# Patient Record
Sex: Female | Born: 1940 | Race: White | Hispanic: No | Marital: Single | Smoking: Current some day smoker
Health system: Southern US, Community
[De-identification: ages and names within clinical notes are randomized; demographics above are authoritative.]

## PROBLEM LIST (undated history)

## (undated) DIAGNOSIS — F0390 Unspecified dementia without behavioral disturbance: Secondary | ICD-10-CM

## (undated) DIAGNOSIS — H547 Unspecified visual loss: Secondary | ICD-10-CM

---

## 2014-04-18 ENCOUNTER — Emergency Department (HOSPITAL_COMMUNITY)
Admission: EM | Admit: 2014-04-18 | Discharge: 2014-04-18 | Disposition: A | Discharge status: Home / Self Care | Payer: Medicare Other | Attending: Emergency Medicine | Admitting: Emergency Medicine

## 2014-04-18 ENCOUNTER — Encounter (HOSPITAL_COMMUNITY): Payer: Self-pay | Admitting: *Deleted

## 2014-04-18 DIAGNOSIS — B86 Scabies: Secondary | ICD-10-CM | POA: Diagnosis not present

## 2014-04-18 DIAGNOSIS — Z720 Tobacco use: Secondary | ICD-10-CM | POA: Diagnosis not present

## 2014-04-18 DIAGNOSIS — R21 Rash and other nonspecific skin eruption: Secondary | ICD-10-CM | POA: Diagnosis present

## 2014-04-18 MED ORDER — PERMETHRIN 5 % EX CREA: TOPICAL_CREAM | CUTANEOUS | Status: DC

## 2014-04-18 NOTE — ED Notes (Signed)
Pt is here with rash all over and itches for the last 3 months and states it is in between toes and starting between fingers

## 2014-04-18 NOTE — ED Provider Notes (Signed)
CSN: [id number]     Arrival date & time 04/18/14  1500 History  This chart was scribed for Teressa LowerVrinda Anna Beaird, NP with Benny LennertJoseph L Zammit, MD by Tonye RoyaltyJoshua Chen, ED Scribe. This patient was seen in room TR05C/TR05C and the patient's care was started at 3:46 PM.    Chief Complaint  Patient presents with  . Rash   Rash This is a new problem. The current episode started more than 1 month ago. The problem is unchanged. The affected locations include the left foot, right foot, left hand and right hand. The rash is characterized by itchiness. She was exposed to nothing. Pertinent negatives include no fever. Treatments tried: medicated powder. The treatment provided no relief.  HPI Comments: Gabrielle Gonzales is a 74 y.o. female who presents to the Emergency Department complaining of itching rash diffusely, including feet, hands, and waistline with onset 3 months ago. She states she has been using medicated powder without improvement.   History reviewed. No pertinent past medical history. History reviewed. No pertinent past surgical history. No family history on file. History  Substance Use Topics  . Smoking status: Current Some Day Smoker  . Smokeless tobacco: Not on file  . Alcohol Use: Yes     Comment: occ   OB History    No data available     Review of Systems  Constitutional: Negative for fever.  Skin: Positive for rash.  All other systems reviewed and are negative.     Allergies  Review of patient's allergies indicates not on file.  Home Medications   Prior to Admission medications   Not on File   BP 141/92 mmHg  Pulse 77  Temp(Src) 98.5 F (36.9 C)  Resp 18  Wt 210 lb 9 oz (95.511 kg)  SpO2 97% Physical Exam  Constitutional: She is oriented to person, place, and time. She appears well-developed and well-nourished.  HENT:  Head: Normocephalic and atraumatic.  Right Ear: External ear normal.  Left Ear: External ear normal.  Mouth/Throat: Oropharynx is clear and moist.  Eyes:  Conjunctivae are normal.  Neck: Normal range of motion. Neck supple.  Pulmonary/Chest: Effort normal.  Musculoskeletal: Normal range of motion.  Neurological: She is alert and oriented to person, place, and time.  Skin: Skin is warm and dry.  Multiple papular areas to webs of hands and feet and waistline  Psychiatric: She has a normal mood and affect.  Nursing note and vitals reviewed.   ED Course  Procedures (including critical care time)  DIAGNOSTIC STUDIES: Oxygen Saturation is 97% on room air, normal by my interpretation.    COORDINATION OF CARE: 3:49 PM Discussed treatment plan with patient at beside, the patient agrees with the plan and has no further questions at this time.   Labs Review Labs Reviewed - No data to display  Imaging Review No results found.   EKG Interpretation None      MDM   Final diagnoses:  Scabies    Rash consistent with scabies; will treat with elemite  I personally performed the services described in this documentation, which was scribed in my presence. The recorded information has been reviewed and is accurate.   Teressa LowerVrinda Rocky Rishel, NP 04/18/14 1606  Benny LennertJoseph L Zammit, MD 04/19/14 [phone]

## 2014-04-18 NOTE — Discharge Instructions (Signed)

## 2015-12-30 ENCOUNTER — Encounter (INDEPENDENT_AMBULATORY_CARE_PROVIDER_SITE_OTHER): Payer: Self-pay | Admitting: Sports Medicine

## 2015-12-30 ENCOUNTER — Ambulatory Visit (INDEPENDENT_AMBULATORY_CARE_PROVIDER_SITE_OTHER): Payer: Medicare Other | Admitting: Sports Medicine

## 2015-12-30 VITALS — BP 136/89 | HR 78 | Ht 63.5 in | Wt 210.0 lb

## 2015-12-30 DIAGNOSIS — M1710 Unilateral primary osteoarthritis, unspecified knee: Secondary | ICD-10-CM | POA: Insufficient documentation

## 2015-12-30 DIAGNOSIS — M1711 Unilateral primary osteoarthritis, right knee: Secondary | ICD-10-CM | POA: Diagnosis not present

## 2015-12-30 DIAGNOSIS — M25461 Effusion, right knee: Secondary | ICD-10-CM | POA: Diagnosis not present

## 2015-12-30 DIAGNOSIS — M179 Osteoarthritis of knee, unspecified: Secondary | ICD-10-CM | POA: Insufficient documentation

## 2015-12-30 MED ORDER — BUPIVACAINE HCL 0.5 % IJ SOLN
2.0000 mL | INTRAMUSCULAR | Status: AC | PRN
  Administered 2015-12-30: 2 mL via INTRA_ARTICULAR

## 2015-12-30 MED ORDER — METHYLPREDNISOLONE ACETATE 40 MG/ML IJ SUSP
40.0000 mg | INTRAMUSCULAR | Status: AC | PRN
  Administered 2015-12-30: 40 mg via INTRA_ARTICULAR

## 2015-12-30 MED ORDER — LIDOCAINE HCL 1 % IJ SOLN
5.0000 mL | INTRAMUSCULAR | Status: AC | PRN
  Administered 2015-12-30: 5 mL

## 2015-12-30 NOTE — Progress Notes (Signed)
Gabrielle Gonzales - 75 y.o. female MRN [id number]  Date of birth: Sep 16, 1940  Office Visit Note: Visit Date: 12/30/2015 PCP: No PCP Per Patient Referred by: No ref. provider found  Subjective: Chief Complaint  Patient presents with  . Right Knee - Follow-up  . Follow-up    Patient states last injection helped (Monovisc).  States right knee hurts every once in a while.  No swelling today.   HPI: Patient reports improved knee pain following Monovisc injection. She has continued to have pain however & is interested in repeat injection today. She reports intermittent swelling but does seem to be less than in the past. She denies any significant mechanical symptoms.Denies fevers, chills, recent weight gain or weight loss.  No night sweats. No significant nighttime awakenings due to this issue.      ROS Otherwise per HPI.  Assessment & Plan: Visit Diagnoses:  1. Effusion of right knee   2. Primary osteoarthritis of right knee     Plan: Findings:  Injection today as below. Okay for repeat injections PRN.  I am happy to follow up with this patient at my new location [facility]) for their chronic ongoing issues.      Meds & Orders: No orders of the defined types were placed in this encounter.   Orders Placed This Encounter  Procedures  . Large Joint Injection/Arthrocentesis  . Large Joint Injection/Arthrocentesis  . Large Joint Injection/Arthrocentesis  . Large Joint Injection/Arthrocentesis    Follow-up: No Follow-up on file.   Procedures: Ultrasound guided RIGHT knee injection Date/Time: 12/30/2015 11:00 AM Performed by: Gaspar BiddingIGBY, Nirvana Blanchett D Authorized by: Gaspar BiddingIGBY, Tyianna Menefee D   Location:  Knee Site:  R knee Needle Size:  22 G Needle Length:  3.5 inches Approach:  Superolateral Ultrasound Guidance: Yes   Fluoroscopic Guidance: No   Arthrogram: No   Medications:  5 mL lidocaine 1 %; 2 mL bupivacaine 0.5 %; 40 mg methylPREDNISolone  acetate 40 MG/ML Aspiration Attempted: No    The patient's clinical condition is marked by substantial pain and/or significant functional disability. Other conservative therapy has not provided relief, is contraindicated, or not appropriate. There is a reasonable likelihood that injection will significantly improve the patient's pain and/or functional impairment.  After discussing the risks, benefits and expected outcomes of the injection and all questions were reviewed and answered, the patient wished to undergo the above named procedure.  Verbal consent was obtained. The target sight was prepped with alcohol scrub. Local anesthesia was obtained with ethyl chloride. Under real-time ultrasound guidance, Injection of the target structure was performed using the above needle and medications under sterile stopcock technique. Band-Aid was applied. The patient tolerated this procedure well with no immediate complications. Post injection instructions were provided.   Ultrasound guided for small effusion only. She had markedly thickened synovium. Patient overall tolerated the procedure well. 3.5" needle was utilized for accessing joint due to soft tissue envelope size.    No notes on file   Clinical History: No specialty comments available.  She reports that she has been smoking.  She does not have any smokeless tobacco history on file. No results for input(s): HGBA1C, LABURIC in the last 8760 hours.  Objective:  VS:  HT:5' 3.5" (161.3 cm)   WT:210 lb (95.3 kg)  BMI:36.7    BP:136/89  HR:78bpm  TEMP: ( )  RESP:  Physical Exam  Constitutional: She appears well-developed and well-nourished. No distress.  Alert and appropriately interactive.  HENT:  Head: Normocephalic and atraumatic.  Pulmonary/Chest: Effort normal. No respiratory distress.  Skin: Skin is warm and dry. No rash noted. She is not diaphoretic. No erythema. No pallor.  Psychiatric: She has a normal mood and affect. Her behavior is  normal. Judgment and thought content normal.    Right Knee Exam   Comments:  Overall well aligned. She has no significant effusion although there is some generalized bogginess of her knee. She has full flexion & extension. Moderate medial & lateral joint line tenderness. Extensor mechanism intact. No pain with McMurray's. Minimal crepitation with patellar grind.     Imaging: No results found.  Past Medical/Family/Surgical/Social History: Medications & Allergies reviewed per EMR Patient Active Problem List   Diagnosis Date Noted  . Knee osteoarthritis 12/30/2015   No past medical history on file. No family history on file. No past surgical history on file. Social History   Occupational History  . Not on file.   Social History Main Topics  . Smoking status: Current Some Day Smoker  . Smokeless tobacco: Not on file  . Alcohol use Yes     Comment: occ  . Drug use: No  . Sexual activity: Not on file

## 2016-03-30 ENCOUNTER — Other Ambulatory Visit: Payer: Self-pay | Admitting: Family Medicine

## 2016-03-30 DIAGNOSIS — F17218 Nicotine dependence, cigarettes, with other nicotine-induced disorders: Secondary | ICD-10-CM

## 2016-04-06 ENCOUNTER — Inpatient Hospital Stay
Admission: RE | Admit: 2016-04-06 | Discharge: 2016-04-06 | Disposition: A | Discharge status: Home / Self Care | Payer: Medicare Other | Source: Ambulatory Visit | Attending: Family Medicine | Admitting: Family Medicine

## 2018-01-02 ENCOUNTER — Ambulatory Visit: Payer: Self-pay | Admitting: Emergency Medicine

## 2018-01-06 ENCOUNTER — Other Ambulatory Visit: Payer: Self-pay

## 2018-01-06 ENCOUNTER — Encounter: Payer: Self-pay | Admitting: Emergency Medicine

## 2018-01-06 ENCOUNTER — Ambulatory Visit (INDEPENDENT_AMBULATORY_CARE_PROVIDER_SITE_OTHER): Payer: Medicare Other | Admitting: Emergency Medicine

## 2018-01-06 VITALS — BP 153/90 | HR 76 | Temp 98.6°F | Resp 12 | Ht 62.0 in | Wt 186.8 lb

## 2018-01-06 DIAGNOSIS — F329 Major depressive disorder, single episode, unspecified: Secondary | ICD-10-CM

## 2018-01-06 DIAGNOSIS — I10 Essential (primary) hypertension: Secondary | ICD-10-CM | POA: Diagnosis not present

## 2018-01-06 DIAGNOSIS — Z23 Encounter for immunization: Secondary | ICD-10-CM

## 2018-01-06 DIAGNOSIS — F32A Depression, unspecified: Secondary | ICD-10-CM

## 2018-01-06 DIAGNOSIS — E785 Hyperlipidemia, unspecified: Secondary | ICD-10-CM | POA: Diagnosis not present

## 2018-01-06 MED ORDER — PAROXETINE HCL 40 MG PO TABS: 40.0000 mg | ORAL_TABLET | ORAL | 3 refills | Status: DC

## 2018-01-06 MED ORDER — SIMVASTATIN 40 MG PO TABS: 40.0000 mg | ORAL_TABLET | Freq: Every day | ORAL | 3 refills | Status: DC

## 2018-01-06 MED ORDER — ENALAPRIL-HYDROCHLOROTHIAZIDE 5-12.5 MG PO TABS: 1.0000 | ORAL_TABLET | Freq: Every day | ORAL | 3 refills | Status: DC

## 2018-01-06 NOTE — Patient Instructions (Addendum)
If you have lab work done today you will be contacted with your lab results within the next 2 weeks.  If you have not heard from [place] then please contact us. The fastest way to get your results is to register for My Chart.   IF you received an x-ray today, you will receive an invoice from [facility]. Please contact [facility] at [phone] with questions or concerns regarding your invoice.   IF you received labwork today, you will receive an invoice from [place]. Please contact [facility] at [phone] with questions or concerns regarding your invoice.   Our billing staff will not be able to assist you with questions regarding bills from these companies.  You will be contacted with the lab results as soon as they are available. The fastest way to get your results is to activate your My Chart account. Instructions are located on the last page of this paperwork. If you have not heard from [place] regarding the results in 2 weeks, please contact this office.     Health Maintenance, Female Adopting a healthy lifestyle and getting preventive care can go a long way to promote health and wellness. Talk with your health care provider about what schedule of regular examinations is right for you. This is a good chance for you to check in with your provider about disease prevention and staying healthy. In between checkups, there are plenty of things you can do on your own. Experts have done a lot of research about which lifestyle changes and preventive measures are most likely to keep you healthy. Ask your health care provider for more information. Weight and diet Eat a healthy diet  Be sure to include plenty of vegetables, fruits, low-fat dairy products, and lean protein.  Do not eat a lot of foods high in solid fats, added sugars, or salt.  Get regular exercise. This is one of the most important things you can do for your health. ? Most adults should exercise for at least 150  minutes each week. The exercise should increase your heart rate and make you sweat (moderate-intensity exercise). ? Most adults should also do strengthening exercises at least twice a week. This is in addition to the moderate-intensity exercise.  Maintain a healthy weight  Body mass index (BMI) is a measurement that can be used to identify possible weight problems. It estimates body fat based on height and weight. Your health care provider can help determine your BMI and help you achieve or maintain a healthy weight.  For females 22 years of age and older: ? A BMI below 18.5 is considered underweight. ? A BMI of 18.5 to 24.9 is normal. ? A BMI of 25 to 29.9 is considered overweight. ? A BMI of 30 and above is considered obese.  Watch levels of cholesterol and blood lipids  You should start having your blood tested for lipids and cholesterol at 75 years of age, then have this test every 5 years.  You may need to have your cholesterol levels checked more often if: ? Your lipid or cholesterol levels are high. ? You are older than 75 years of age. ? You are at high risk for heart disease.  Cancer screening Lung Cancer  Lung cancer screening is recommended for adults 58-34 years old who are at high risk for lung cancer because of a history of smoking.  A yearly low-dose CT scan of the lungs is recommended for people who: ? Currently smoke. ? Have quit  within the past 15 years. ? Have at least a 30-pack-year history of smoking. A pack year is smoking an average of one pack of cigarettes a day for 1 year.  Yearly screening should continue until it has been 15 years since you quit.  Yearly screening should stop if you develop a health problem that would prevent you from having lung cancer treatment.  Breast Cancer  Practice breast self-awareness. This means understanding how your breasts normally appear and feel.  It also means doing regular breast self-exams. Let your health care  provider know about any changes, no matter how small.  If you are in your 20s or 30s, you should have a clinical breast exam (CBE) by a health care provider every 1-3 years as part of a regular health exam.  If you are 48 or older, have a CBE every year. Also consider having a breast X-ray (mammogram) every year.  If you have a family history of breast cancer, talk to your health care provider about genetic screening.  If you are at high risk for breast cancer, talk to your health care provider about having an MRI and a mammogram every year.  Breast cancer gene (BRCA) assessment is recommended for women who have family members with BRCA-related cancers. BRCA-related cancers include: ? Breast. ? Ovarian. ? Tubal. ? Peritoneal cancers.  Results of the assessment will determine the need for genetic counseling and BRCA1 and BRCA2 testing.  Cervical Cancer Your health care provider may recommend that you be screened regularly for cancer of the pelvic organs (ovaries, uterus, and vagina). This screening involves a pelvic examination, including checking for microscopic changes to the surface of your cervix (Pap test). You may be encouraged to have this screening done every 3 years, beginning at age 8.  For women ages 8-65, health care providers may recommend pelvic exams and Pap testing every 3 years, or they may recommend the Pap and pelvic exam, combined with testing for human papilloma virus (HPV), every 5 years. Some types of HPV increase your risk of cervical cancer. Testing for HPV may also be done on women of any age with unclear Pap test results.  Other health care providers may not recommend any screening for nonpregnant women who are considered low risk for pelvic cancer and who do not have symptoms. Ask your health care provider if a screening pelvic exam is right for you.  If you have had past treatment for cervical cancer or a condition that could lead to cancer, you need Pap tests  and screening for cancer for at least 20 years after your treatment. If Pap tests have been discontinued, your risk factors (such as having a new sexual partner) need to be reassessed to determine if screening should resume. Some women have medical problems that increase the chance of getting cervical cancer. In these cases, your health care provider may recommend more frequent screening and Pap tests.  Colorectal Cancer  This type of cancer can be detected and often prevented.  Routine colorectal cancer screening usually begins at 75 years of age and continues through 75 years of age.  Your health care provider may recommend screening at an earlier age if you have risk factors for colon cancer.  Your health care provider may also recommend using home test kits to check for hidden blood in the stool.  A small camera at the end of a tube can be used to examine your colon directly (sigmoidoscopy or colonoscopy). This is done to check  for the earliest forms of colorectal cancer.  Routine screening usually begins at age 75.  Direct examination of the colon should be repeated every 5-10 years through 75 years of age. However, you may need to be screened more often if early forms of precancerous polyps or small growths are found.  Skin Cancer  Check your skin from head to toe regularly.  Tell your health care provider about any new moles or changes in moles, especially if there is a change in a mole's shape or color.  Also tell your health care provider if you have a mole that is larger than the size of a pencil eraser.  Always use sunscreen. Apply sunscreen liberally and repeatedly throughout the day.  Protect yourself by wearing long sleeves, pants, a wide-brimmed hat, and sunglasses whenever you are outside.  Heart disease, diabetes, and high blood pressure  High blood pressure causes heart disease and increases the risk of stroke. High blood pressure is more likely to develop  in: ? People who have blood pressure in the high end of the normal range (130-139/85-89 mm Hg). ? People who are overweight or obese. ? People who are African American.  If you are 41-67 years of age, have your blood pressure checked every 3-5 years. If you are 32 years of age or older, have your blood pressure checked every year. You should have your blood pressure measured twice-once when you are at a hospital or clinic, and once when you are not at a hospital or clinic. Record the average of the two measurements. To check your blood pressure when you are not at a hospital or clinic, you can use: ? An automated blood pressure machine at a pharmacy. ? A home blood pressure monitor.  If you are between 44 years and 70 years old, ask your health care provider if you should take aspirin to prevent strokes.  Have regular diabetes screenings. This involves taking a blood sample to check your fasting blood sugar level. ? If you are at a normal weight and have a low risk for diabetes, have this test once every three years after 75 years of age. ? If you are overweight and have a high risk for diabetes, consider being tested at a younger age or more often. Preventing infection Hepatitis B  If you have a higher risk for hepatitis B, you should be screened for this virus. You are considered at high risk for hepatitis B if: ? You were born in a country where hepatitis B is common. Ask your health care provider which countries are considered high risk. ? Your parents were born in a high-risk country, and you have not been immunized against hepatitis B (hepatitis B vaccine). ? You have HIV or AIDS. ? You use needles to inject street drugs. ? You live with someone who has hepatitis B. ? You have had sex with someone who has hepatitis B. ? You get hemodialysis treatment. ? You take certain medicines for conditions, including cancer, organ transplantation, and autoimmune conditions.  Hepatitis C  Blood  testing is recommended for: ? Everyone born from [birth year] through 1963. ? Anyone with known risk factors for hepatitis C.  Sexually transmitted infections (STIs)  You should be screened for sexually transmitted infections (STIs) including gonorrhea and chlamydia if: ? You are sexually active and are younger than 75 years of age. ? You are older than 75 years of age and your health care provider tells you that you are at risk for  this type of infection. ? Your sexual activity has changed since you were last screened and you are at an increased risk for chlamydia or gonorrhea. Ask your health care provider if you are at risk.  If you do not have HIV, but are at risk, it may be recommended that you take a prescription medicine daily to prevent HIV infection. This is called pre-exposure prophylaxis (PrEP). You are considered at risk if: ? You are sexually active and do not regularly use condoms or know the HIV status of your partner(s). ? You take drugs by injection. ? You are sexually active with a partner who has HIV.  Talk with your health care provider about whether you are at high risk of being infected with HIV. If you choose to begin PrEP, you should first be tested for HIV. You should then be tested every 3 months for as long as you are taking PrEP. Pregnancy  If you are premenopausal and you may become pregnant, ask your health care provider about preconception counseling.  If you may become pregnant, take 400 to 800 micrograms (mcg) of folic acid every day.  If you want to prevent pregnancy, talk to your health care provider about birth control (contraception). Osteoporosis and menopause  Osteoporosis is a disease in which the bones lose minerals and strength with aging. This can result in serious bone fractures. Your risk for osteoporosis can be identified using a bone density scan.  If you are 68 years of age or older, or if you are at risk for osteoporosis and fractures, ask your  health care provider if you should be screened.  Ask your health care provider whether you should take a calcium or vitamin D supplement to lower your risk for osteoporosis.  Menopause may have certain physical symptoms and risks.  Hormone replacement therapy may reduce some of these symptoms and risks. Talk to your health care provider about whether hormone replacement therapy is right for you. Follow these instructions at home:  Schedule regular health, dental, and eye exams.  Stay current with your immunizations.  Do not use any tobacco products including cigarettes, chewing tobacco, or electronic cigarettes.  If you are pregnant, do not drink alcohol.  If you are breastfeeding, limit how much and how often you drink alcohol.  Limit alcohol intake to no more than 1 drink per day for nonpregnant women. One drink equals 12 ounces of beer, 5 ounces of wine, or 1 ounces of hard liquor.  Do not use street drugs.  Do not share needles.  Ask your health care provider for help if you need support or information about quitting drugs.  Tell your health care provider if you often feel depressed.  Tell your health care provider if you have ever been abused or do not feel safe at home. This information is not intended to replace advice given to you by your health care provider. Make sure you discuss any questions you have with your health care provider. Document Released: 08/17/2010 Document Revised: 07/10/2015 Document Reviewed: 11/05/2014 Elsevier Interactive Patient Education  [employer].

## 2018-01-06 NOTE — Progress Notes (Signed)
Gabrielle Gonzales 77 y.o.   Chief Complaint  Patient presents with  . Establish Care    need refill on medication enalapril, paroxetine, simvastatin    HISTORY OF PRESENT ILLNESS: This is a 77 y.o. female here to establish care.  First visit to this practice.  Chronic smoker with history of hypertension, depression, and high cholesterol.  Has no desire to quit smoking.  No history of COPD or asthma.  Also requesting medication refills.  HPI   Prior to Admission medications   Medication Sig Start Date End Date Taking? Authorizing Provider  Calcium Carb-Cholecalciferol [phone] MG-UNIT CAPS Take by mouth.   Yes [provider]  Enalapril-Hydrochlorothiazide 5-12.5 MG tablet TAKE ONE TABLET BY MOUTH ONCE DAILY 05/22/15  Yes [provider]  PARoxetine (PAXIL) 40 MG tablet Take by mouth. 03/24/15  Yes [provider]  permethrin (ELIMITE) 5 % cream Apply to affected area once 04/18/14  Yes Glendell Docker, NP  raloxifene (EVISTA) 60 MG tablet TAKE ONE TABLET BY MOUTH ONCE DAILY 04/24/15  Yes [provider]  triamcinolone cream (KENALOG) 0.1 % APPLY TOPICALLY TWICE DAILY 09/01/15  Yes [provider]  fluticasone (FLONASE) 50 MCG/ACT nasal spray Place into the nose. 04/29/15 04/28/16  [provider]  simvastatin (ZOCOR) 40 MG tablet Take by mouth. 03/25/15 03/24/16  [provider]    No Known Allergies  Patient Active Problem List   Diagnosis Date Noted  . 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  No past surgical history on file.  Social History   Socioeconomic History  . Marital status: Single    Spouse name: Not on file  . Number of children: Not on file  . Years of education: Not on file  . Highest education level: Not on file  Occupational History  . Not on file  Social Needs  . Financial resource strain: Not on file  . Food insecurity:    Worry: Not on file    Inability: Not on file  .  Transportation needs:    Medical: Not on file    Non-medical: Not on file  Tobacco Use  . Smoking status: Current Some Day Smoker    Packs/day: 1.00    Types: Cigarettes  . Smokeless tobacco: Current User  Substance and Sexual Activity  . Alcohol use: Yes    Comment: occ  . Drug use: No  . Sexual activity: Not on file  Lifestyle  . Physical activity:    Days per week: Not on file    Minutes per session: Not on file  . Stress: Not on file  Relationships  . Social connections:    Talks on phone: Not on file    Gets together: Not on file    Attends religious service: Not on file    Active member of club or organization: Not on file    Attends meetings of clubs or organizations: Not on file    Relationship status: Not on file  . Intimate partner violence:    Fear of current or ex partner: Not on file    Emotionally abused: Not on file    Physically abused: Not on file    Forced sexual activity: Not on file  Other Topics Concern  . Not on file  Social History Narrative  . Not on file    No family history on file.   Review of Systems  Constitutional: Negative.  Negative for chills and fever.  HENT:  Negative.  Negative for hearing loss and sore throat.   Eyes: Negative.  Negative for blurred vision and double vision.  Respiratory: Negative.  Negative for cough and shortness of breath.   Cardiovascular: Negative.  Negative for chest pain and palpitations.  Gastrointestinal: Negative.  Negative for nausea and vomiting.  Genitourinary: Negative.  Negative for dysuria.  Musculoskeletal: Negative.  Negative for back pain, myalgias and neck pain.  Skin: Negative.  Negative for rash.  Neurological: Negative.  Negative for dizziness and headaches.  Endo/Heme/Allergies: Negative.   All other systems reviewed and are negative.   Vitals:   01/06/18 1336 01/06/18 1338  BP: (!) 155/96 (!) 153/90  Pulse: 76   Resp: 12   Temp: 98.6 F (37 C)   SpO2: 95%     Physical Exam   Constitutional: She is oriented to person, place, and time. She appears well-developed and well-nourished.  HENT:  Head: Normocephalic and atraumatic.  Nose: Nose normal.  Mouth/Throat: Oropharynx is clear and moist.  Eyes: Pupils are equal, round, and reactive to light. Conjunctivae and EOM are normal.  Neck: Normal range of motion. Neck supple. No thyromegaly present.  Cardiovascular: Normal rate, regular rhythm and normal heart sounds.  Pulmonary/Chest: Effort normal and breath sounds normal.  Abdominal: Soft. She exhibits no distension. There is no tenderness.  Musculoskeletal: Normal range of motion. She exhibits no edema.  Lymphadenopathy:    She has no cervical adenopathy.  Neurological: She is alert and oriented to person, place, and time. No sensory deficit. She exhibits normal muscle tone. Coordination normal.  Skin: Skin is warm and dry. Capillary refill takes less than 2 seconds.  Psychiatric: She has a normal mood and affect. Her behavior is normal.  Vitals reviewed.  A total of 30 minutes was spent in the room with the patient, greater than 50% of which was in counseling/coordination of care regarding chronic medical problems, treatment, medications, and need for follow-up.   ASSESSMENT & PLAN: Gabrielle Gonzales was seen today for establish care.  Diagnoses and all orders for this visit:  Flu vaccine need -     Flu vaccine HIGH DOSE PF (Fluzone High dose)  Need for vaccination against Streptococcus pneumoniae using pneumococcal conjugate vaccine 13 -     Pneumococcal conjugate vaccine 13-valent  Dyslipidemia -     simvastatin (ZOCOR) 40 MG tablet; Take 1 tablet (40 mg total) by mouth daily at 6 PM.  Depression, unspecified depression type -     PARoxetine (PAXIL) 40 MG tablet; Take 1 tablet (40 mg total) by mouth every morning.  Essential hypertension -     Enalapril-hydroCHLOROthiazide 5-12.5 MG tablet; Take 1 tablet by mouth daily.   Patient Instructions        If you have lab work done today you will be contacted with your lab results within the next 2 weeks.  If you have not heard from [place] then please contact us. The fastest way to get your results is to register for My Chart.   IF you received an x-ray today, you will receive an invoice from [facility]. Please contact [facility] at [phone] with questions or concerns regarding your invoice.   IF you received labwork today, you will receive an invoice from [place]. Please contact [facility] at [phone] with questions or concerns regarding your invoice.   Our billing staff will not be able to assist you with questions regarding bills from these companies.  You will be contacted with the lab results as soon as  they are available. The fastest way to get your results is to activate your My Chart account. Instructions are located on the last page of this paperwork. If you have not heard from [place] regarding the results in 2 weeks, please contact this office.     Health Maintenance, Female Adopting a healthy lifestyle and getting preventive care can go a long way to promote health and wellness. Talk with your health care provider about what schedule of regular examinations is right for you. This is a good chance for you to check in with your provider about disease prevention and staying healthy. In between checkups, there are plenty of things you can do on your own. Experts have done a lot of research about which lifestyle changes and preventive measures are most likely to keep you healthy. Ask your health care provider for more information. Weight and diet Eat a healthy diet  Be sure to include plenty of vegetables, fruits, low-fat dairy products, and lean protein.  Do not eat a lot of foods high in solid fats, added sugars, or salt.  Get regular exercise. This is one of the most important things you can do for your health. ? Most adults should exercise for at least  150 minutes each week. The exercise should increase your heart rate and make you sweat (moderate-intensity exercise). ? Most adults should also do strengthening exercises at least twice a week. This is in addition to the moderate-intensity exercise.  Maintain a healthy weight  Body mass index (BMI) is a measurement that can be used to identify possible weight problems. It estimates body fat based on height and weight. Your health care provider can help determine your BMI and help you achieve or maintain a healthy weight.  For females 62 years of age and older: ? A BMI below 18.5 is considered underweight. ? A BMI of 18.5 to 24.9 is normal. ? A BMI of 25 to 29.9 is considered overweight. ? A BMI of 30 and above is considered obese.  Watch levels of cholesterol and blood lipids  You should start having your blood tested for lipids and cholesterol at 75 years of age, then have this test every 5 years.  You may need to have your cholesterol levels checked more often if: ? Your lipid or cholesterol levels are high. ? You are older than 75 years of age. ? You are at high risk for heart disease.  Cancer screening Lung Cancer  Lung cancer screening is recommended for adults 77-63 years old who are at high risk for lung cancer because of a history of smoking.  A yearly low-dose CT scan of the lungs is recommended for people who: ? Currently smoke. ? Have quit within the past 15 years. ? Have at least a 30-pack-year history of smoking. A pack year is smoking an average of one pack of cigarettes a day for 1 year.  Yearly screening should continue until it has been 15 years since you quit.  Yearly screening should stop if you develop a health problem that would prevent you from having lung cancer treatment.  Breast Cancer  Practice breast self-awareness. This means understanding how your breasts normally appear and feel.  It also means doing regular breast self-exams. Let your health care  provider know about any changes, no matter how small.  If you are in your 20s or 30s, you should have a clinical breast exam (CBE) by a health care provider every 1-3 years as part of a regular  health exam.  If you are 40 or older, have a CBE every year. Also consider having a breast X-ray (mammogram) every year.  If you have a family history of breast cancer, talk to your health care provider about genetic screening.  If you are at high risk for breast cancer, talk to your health care provider about having an MRI and a mammogram every year.  Breast cancer gene (BRCA) assessment is recommended for women who have family members with BRCA-related cancers. BRCA-related cancers include: ? Breast. ? Ovarian. ? Tubal. ? Peritoneal cancers.  Results of the assessment will determine the need for genetic counseling and BRCA1 and BRCA2 testing.  Cervical Cancer Your health care provider may recommend that you be screened regularly for cancer of the pelvic organs (ovaries, uterus, and vagina). This screening involves a pelvic examination, including checking for microscopic changes to the surface of your cervix (Pap test). You may be encouraged to have this screening done every 3 years, beginning at age 2.  For women ages 76-65, health care providers may recommend pelvic exams and Pap testing every 3 years, or they may recommend the Pap and pelvic exam, combined with testing for human papilloma virus (HPV), every 5 years. Some types of HPV increase your risk of cervical cancer. Testing for HPV may also be done on women of any age with unclear Pap test results.  Other health care providers may not recommend any screening for nonpregnant women who are considered low risk for pelvic cancer and who do not have symptoms. Ask your health care provider if a screening pelvic exam is right for you.  If you have had past treatment for cervical cancer or a condition that could lead to cancer, you need Pap tests  and screening for cancer for at least 20 years after your treatment. If Pap tests have been discontinued, your risk factors (such as having a new sexual partner) need to be reassessed to determine if screening should resume. Some women have medical problems that increase the chance of getting cervical cancer. In these cases, your health care provider may recommend more frequent screening and Pap tests.  Colorectal Cancer  This type of cancer can be detected and often prevented.  Routine colorectal cancer screening usually begins at 75 years of age and continues through 75 years of age.  Your health care provider may recommend screening at an earlier age if you have risk factors for colon cancer.  Your health care provider may also recommend using home test kits to check for hidden blood in the stool.  A small camera at the end of a tube can be used to examine your colon directly (sigmoidoscopy or colonoscopy). This is done to check for the earliest forms of colorectal cancer.  Routine screening usually begins at age 71.  Direct examination of the colon should be repeated every 5-10 years through 75 years of age. However, you may need to be screened more often if early forms of precancerous polyps or small growths are found.  Skin Cancer  Check your skin from head to toe regularly.  Tell your health care provider about any new moles or changes in moles, especially if there is a change in a mole's shape or color.  Also tell your health care provider if you have a mole that is larger than the size of a pencil eraser.  Always use sunscreen. Apply sunscreen liberally and repeatedly throughout the day.  Protect yourself by wearing long sleeves, pants, a wide-brimmed  hat, and sunglasses whenever you are outside.  Heart disease, diabetes, and high blood pressure  High blood pressure causes heart disease and increases the risk of stroke. High blood pressure is more likely to develop  in: ? People who have blood pressure in the high end of the normal range (130-139/85-89 mm Hg). ? People who are overweight or obese. ? People who are African American.  If you are 59-59 years of age, have your blood pressure checked every 3-5 years. If you are 9 years of age or older, have your blood pressure checked every year. You should have your blood pressure measured twice-once when you are at a hospital or clinic, and once when you are not at a hospital or clinic. Record the average of the two measurements. To check your blood pressure when you are not at a hospital or clinic, you can use: ? An automated blood pressure machine at a pharmacy. ? A home blood pressure monitor.  If you are between 11 years and 87 years old, ask your health care provider if you should take aspirin to prevent strokes.  Have regular diabetes screenings. This involves taking a blood sample to check your fasting blood sugar level. ? If you are at a normal weight and have a low risk for diabetes, have this test once every three years after 75 years of age. ? If you are overweight and have a high risk for diabetes, consider being tested at a younger age or more often. Preventing infection Hepatitis B  If you have a higher risk for hepatitis B, you should be screened for this virus. You are considered at high risk for hepatitis B if: ? You were born in a country where hepatitis B is common. Ask your health care provider which countries are considered high risk. ? Your parents were born in a high-risk country, and you have not been immunized against hepatitis B (hepatitis B vaccine). ? You have HIV or AIDS. ? You use needles to inject street drugs. ? You live with someone who has hepatitis B. ? You have had sex with someone who has hepatitis B. ? You get hemodialysis treatment. ? You take certain medicines for conditions, including cancer, organ transplantation, and autoimmune conditions.  Hepatitis C  Blood  testing is recommended for: ? Everyone born from [birth year] through 1963. ? Anyone with known risk factors for hepatitis C.  Sexually transmitted infections (STIs)  You should be screened for sexually transmitted infections (STIs) including gonorrhea and chlamydia if: ? You are sexually active and are younger than 75 years of age. ? You are older than 74 years of age and your health care provider tells you that you are at risk for this type of infection. ? Your sexual activity has changed since you were last screened and you are at an increased risk for chlamydia or gonorrhea. Ask your health care provider if you are at risk.  If you do not have HIV, but are at risk, it may be recommended that you take a prescription medicine daily to prevent HIV infection. This is called pre-exposure prophylaxis (PrEP). You are considered at risk if: ? You are sexually active and do not regularly use condoms or know the HIV status of your partner(s). ? You take drugs by injection. ? You are sexually active with a partner who has HIV.  Talk with your health care provider about whether you are at high risk of being infected with HIV. If you choose to begin  PrEP, you should first be tested for HIV. You should then be tested every 3 months for as long as you are taking PrEP. Pregnancy  If you are premenopausal and you may become pregnant, ask your health care provider about preconception counseling.  If you may become pregnant, take 400 to 800 micrograms (mcg) of folic acid every day.  If you want to prevent pregnancy, talk to your health care provider about birth control (contraception). Osteoporosis and menopause  Osteoporosis is a disease in which the bones lose minerals and strength with aging. This can result in serious bone fractures. Your risk for osteoporosis can be identified using a bone density scan.  If you are 10 years of age or older, or if you are at risk for osteoporosis and fractures, ask your  health care provider if you should be screened.  Ask your health care provider whether you should take a calcium or vitamin D supplement to lower your risk for osteoporosis.  Menopause may have certain physical symptoms and risks.  Hormone replacement therapy may reduce some of these symptoms and risks. Talk to your health care provider about whether hormone replacement therapy is right for you. Follow these instructions at home:  Schedule regular health, dental, and eye exams.  Stay current with your immunizations.  Do not use any tobacco products including cigarettes, chewing tobacco, or electronic cigarettes.  If you are pregnant, do not drink alcohol.  If you are breastfeeding, limit how much and how often you drink alcohol.  Limit alcohol intake to no more than 1 drink per day for nonpregnant women. One drink equals 12 ounces of beer, 5 ounces of wine, or 1 ounces of hard liquor.  Do not use street drugs.  Do not share needles.  Ask your health care provider for help if you need support or information about quitting drugs.  Tell your health care provider if you often feel depressed.  Tell your health care provider if you have ever been abused or do not feel safe at home. This information is not intended to replace advice given to you by your health care provider. Make sure you discuss any questions you have with your health care provider. Document Released: 08/17/2010 Document Revised: 07/10/2015 Document Reviewed: 11/05/2014 Elsevier Interactive Patient Education  2018 Elsevier Inc.        Agustina Caroli, MD [facility]

## 2018-01-16 ENCOUNTER — Telehealth: Payer: Self-pay | Admitting: Emergency Medicine

## 2018-01-16 ENCOUNTER — Telehealth: Payer: Self-pay

## 2018-01-16 NOTE — Telephone Encounter (Signed)
Fax req from [facility] for alternative to Enalapril HCTZ 5-12.5mg  Sent to Dr. Alvy BimlerSagardia

## 2018-01-16 NOTE — Telephone Encounter (Signed)
Copied from CRM [id number]. Topic: Quick Communication - See Telephone Encounter >> Jan 16, 2018 12:58 PM Windy KalataMichael, Catcher Dehoyos L, NT wrote: CRM for notification. See Telephone encounter for: 01/16/18.  Patient is calling and states [facility] does not have Enalapril-hydroCHLOROthiazide 5-12.5 MG tablet in stock. She is needing something in place of that.

## 2018-01-17 MED ORDER — LISINOPRIL-HYDROCHLOROTHIAZIDE 10-12.5 MG PO TABS: 1.0000 | ORAL_TABLET | Freq: Every day | ORAL | 3 refills | Status: DC

## 2018-01-17 NOTE — Telephone Encounter (Signed)
New prescription sent. Thanks

## 2018-01-17 NOTE — Addendum Note (Signed)
Addended by: Evie LacksSAGARDIA, Kniyah Khun J on: 01/17/2018 08:08 AM   Modules accepted: Orders

## 2018-01-24 ENCOUNTER — Telehealth: Payer: Self-pay | Admitting: Emergency Medicine

## 2018-01-24 NOTE — Telephone Encounter (Signed)
John, CSR from [facility] called and says the patient has duplicate medication therapy. He says the patient has on profile Enalapril-HCTZ 5-12.5 mg and Lisinopril-HCTZ 10-12.5 mg and which one is the patient supposed to be on. I advised according to the chart, the patient called on 01/16/18 and said [facility] was out of stock on the Enalapril-HCTZ and a substitution needed to be sent in, so the Dr. Alvy BimlerSagardia sent in Lisinopril-HCTZ 10-12.5 mg. He transferred me to [facility] [place] to check on the Enalapril being out of stock. She says the [place] Cottanalapril-HCTZ is out of stock and she will remove it from the patient's profile and fill the Lisinopril-HCTZ 10-12.5 mg #90/3 refills that was received on 01/17/18.

## 2021-02-13 ENCOUNTER — Emergency Department (HOSPITAL_COMMUNITY): Payer: Medicare HMO

## 2021-02-13 ENCOUNTER — Encounter (HOSPITAL_COMMUNITY): Payer: Self-pay

## 2021-02-13 ENCOUNTER — Emergency Department (HOSPITAL_COMMUNITY)
Admission: EM | Admit: 2021-02-13 | Discharge: 2021-02-13 | Disposition: A | Discharge status: Home / Self Care | Payer: Medicare HMO | Attending: Emergency Medicine | Admitting: Emergency Medicine

## 2021-02-13 ENCOUNTER — Other Ambulatory Visit: Payer: Self-pay

## 2021-02-13 DIAGNOSIS — S32502A Unspecified fracture of left pubis, initial encounter for closed fracture: Secondary | ICD-10-CM | POA: Diagnosis not present

## 2021-02-13 DIAGNOSIS — F17210 Nicotine dependence, cigarettes, uncomplicated: Secondary | ICD-10-CM | POA: Diagnosis not present

## 2021-02-13 DIAGNOSIS — W19XXXA Unspecified fall, initial encounter: Secondary | ICD-10-CM | POA: Insufficient documentation

## 2021-02-13 DIAGNOSIS — S322XXA Fracture of coccyx, initial encounter for closed fracture: Secondary | ICD-10-CM | POA: Insufficient documentation

## 2021-02-13 DIAGNOSIS — I7143 Infrarenal abdominal aortic aneurysm, without rupture: Secondary | ICD-10-CM | POA: Diagnosis not present

## 2021-02-13 DIAGNOSIS — I714 Abdominal aortic aneurysm, without rupture, unspecified: Secondary | ICD-10-CM

## 2021-02-13 DIAGNOSIS — E876 Hypokalemia: Secondary | ICD-10-CM | POA: Diagnosis not present

## 2021-02-13 DIAGNOSIS — S3210XA Unspecified fracture of sacrum, initial encounter for closed fracture: Secondary | ICD-10-CM | POA: Insufficient documentation

## 2021-02-13 DIAGNOSIS — S3992XA Unspecified injury of lower back, initial encounter: Secondary | ICD-10-CM | POA: Diagnosis present

## 2021-02-13 DIAGNOSIS — S32592A Other specified fracture of left pubis, initial encounter for closed fracture: Secondary | ICD-10-CM

## 2021-02-13 LAB — BASIC METABOLIC PANEL
Anion gap: 12 (ref 5–15)
BUN: 14 mg/dL (ref 8–23)
CO2: 24 mmol/L (ref 22–32)
Calcium: 8.4 mg/dL — ABNORMAL LOW (ref 8.9–10.3)
Chloride: 105 mmol/L (ref 98–111)
Creatinine, Ser: 0.82 mg/dL (ref 0.44–1.00)
GFR, Estimated: >60 mL/min (ref >60)
Glucose, Bld: 84 mg/dL (ref 70–99)
Potassium: 2.9 mmol/L — ABNORMAL LOW (ref 3.5–5.1)
Sodium: 141 mmol/L (ref 135–145)

## 2021-02-13 LAB — CBC
HCT: 45.7 % (ref 36.0–46.0)
Hemoglobin: 15.6 g/dL — ABNORMAL HIGH (ref 12.0–15.0)
MCH: 31.8 pg (ref 26.0–34.0)
MCHC: 34.1 g/dL (ref 30.0–36.0)
MCV: 93.3 fL (ref 80.0–100.0)
Platelets: 191 10*3/uL (ref 150–400)
RBC: 4.9 MIL/uL (ref 3.87–5.11)
RDW: 13.8 % (ref 11.5–15.5)
WBC: 4 10*3/uL (ref 4.0–10.5)
nRBC: 0 % (ref 0.0–0.2)

## 2021-02-13 MED ORDER — POTASSIUM CHLORIDE CRYS ER 20 MEQ PO TBCR
40.0000 meq | EXTENDED_RELEASE_TABLET | Freq: Once | ORAL | Status: AC
Start: 2021-02-13 — End: 2021-02-13
  Administered 2021-02-13: 17:00:00 40 meq via ORAL
  Filled 2021-02-13: qty 2

## 2021-02-13 MED ORDER — POTASSIUM CHLORIDE CRYS ER 20 MEQ PO TBCR: 20.0000 meq | EXTENDED_RELEASE_TABLET | Freq: Two times a day (BID) | ORAL | 0 refills | Status: DC

## 2021-02-13 MED ORDER — HYDROCODONE-ACETAMINOPHEN 5-325 MG PO TABS: 1.0000 | ORAL_TABLET | Freq: Four times a day (QID) | ORAL | 0 refills | Status: DC | PRN

## 2021-02-13 NOTE — ED Notes (Signed)
In [place] and [place]

## 2021-02-13 NOTE — ED Triage Notes (Signed)
BIB EMS reports fall from 4 days ago.  Reports hit right hip and having pain.  Tender to touch but no shortening or rotation. No thinners.  Small abrasion to left eyebrow

## 2021-02-13 NOTE — ED Provider Notes (Signed)
Patient feels well.  We will give her some pain medicine for breakthrough pain but she is been walking on it at home and has been having this injury for over a week.  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  We will refer her to orthopedics for the fractures.  Otherwise she can walk with a walker and is stable for discharge home.  We will give potassium for outpatient replacement.   Pricilla Loveless, MD 02/13/21 [phone]

## 2021-02-13 NOTE — ED Provider Notes (Signed)
[facility] EMERGENCY DEPARTMENT Provider Note   CSN: [id number] Arrival date & time: 02/13/21  1322     History Chief Complaint  Patient presents with   Gabrielle Gonzales is a 80 y.o. female.   Fall   Patient presented to the ED for evaluation of pain after recent fall.  Patient states she stumbled over her feet a few days ago.  She ended up falling and injuring herself.  Patient states since that time she has been having pain in both of her hips although the right is somewhat worse.  She also has some pain in her lower back.  She denies any headache or chest pain.  No fevers or chills.  No shortness of breath.  No vomiting or diarrhea.  Patient has been able to walk around but because of the persistent pain she came to the ED.  History reviewed. No pertinent past medical history.  Patient Active Problem List   Diagnosis Date Noted   Knee osteoarthritis 12/30/2015    History reviewed. No pertinent surgical history.   OB History   No obstetric history on file.     No family history on file.  Social History   Tobacco Use   Smoking status: Some Days    Packs/day: 1.00    Types: Cigarettes   Smokeless tobacco: Current  Substance Use Topics   Alcohol use: Yes    Comment: occ   Drug use: No    Home Medications Prior to Admission medications   Medication Sig Start Date End Date Taking? Authorizing Provider  HYDROcodone-acetaminophen (NORCO) 5-325 MG tablet Take 1 tablet by mouth every 6 (six) hours as needed. 02/13/21  Yes Pricilla Loveless, MD  potassium chloride SA (KLOR-CON M) 20 MEQ tablet Take 1 tablet (20 mEq total) by mouth 2 (two) times daily for 5 days. 02/13/21 02/18/21 Yes Pricilla Loveless, MD  Calcium Carb-Cholecalciferol [phone] MG-UNIT CAPS Take by mouth.    [provider]  Enalapril-hydroCHLOROthiazide 5-12.5 MG tablet Take 1 tablet by mouth daily. 01/06/18 04/06/18  Georgina Quint, MD  fluticasone [facility]) 50  MCG/ACT nasal spray Place into the nose. 04/29/15 04/28/16  [provider]  lisinopril-hydrochlorothiazide (PRINZIDE,ZESTORETIC) 10-12.5 MG tablet Take 1 tablet by mouth daily. 01/17/18   Georgina Quint, MD  PARoxetine (PAXIL) 40 MG tablet Take 1 tablet (40 mg total) by mouth every morning. 01/06/18 04/06/18  Georgina Quint, MD  permethrin (ELIMITE) 5 % cream Apply to affected area once 04/18/14   Teressa Lower, NP  raloxifene (EVISTA) 60 MG tablet TAKE ONE TABLET BY MOUTH ONCE DAILY 04/24/15   [provider]  simvastatin (ZOCOR) 40 MG tablet Take 1 tablet (40 mg total) by mouth daily at 6 PM. 01/06/18 01/06/19  Sagardia, Eilleen Kempf, MD  triamcinolone cream (KENALOG) 0.1 % APPLY TOPICALLY TWICE DAILY 09/01/15   [provider]    Allergies    Patient has no known allergies.  Review of Systems   Review of Systems  All other systems reviewed and are negative.  Physical Exam Updated Vital Signs BP 137/77    Pulse 74    Temp 99.2 F (37.3 C) (Oral)    Resp (!) 22    Ht 1.626 m (5\' 4" )    Wt 81.6 kg    SpO2 94%    BMI 30.90 kg/m   Physical Exam Vitals and nursing note reviewed.  Constitutional:      Appearance: She is well-developed. She  is not diaphoretic.  HENT:     Head: Normocephalic and atraumatic.     Right Ear: External ear normal.     Left Ear: External ear normal.  Eyes:     General: No scleral icterus.       Right eye: No discharge.        Left eye: No discharge.     Conjunctiva/sclera: Conjunctivae normal.  Neck:     Trachea: No tracheal deviation.  Cardiovascular:     Rate and Rhythm: Normal rate and regular rhythm.  Pulmonary:     Effort: Pulmonary effort is normal. No respiratory distress.     Breath sounds: Normal breath sounds. No stridor. No wheezing or rales.  Abdominal:     General: Bowel sounds are normal. There is no distension.     Palpations: Abdomen is soft.     Tenderness: There is no abdominal tenderness. There  is no guarding or rebound.  Musculoskeletal:        General: No deformity.     Cervical back: Neck supple.     Thoracic back: No tenderness.     Lumbar back: Tenderness present.     Right hip: Tenderness present.     Left hip: Tenderness present.  Skin:    General: Skin is warm and dry.     Findings: No rash.  Neurological:     General: No focal deficit present.     Mental Status: She is alert.     Cranial Nerves: No cranial nerve deficit (no facial droop, extraocular movements intact, no slurred speech).     Sensory: No sensory deficit.     Motor: No abnormal muscle tone or seizure activity.     Coordination: Coordination normal.  Psychiatric:        Mood and Affect: Mood normal.    ED Results / Procedures / Treatments   Labs (all labs ordered are listed, but only abnormal results are displayed) Labs Reviewed  CBC - Abnormal; Notable for the following components:      Result Value   Hemoglobin 15.6 (*)    All other components within normal limits  BASIC METABOLIC PANEL - Abnormal; Notable for the following components:   Potassium 2.9 (*)    Calcium 8.4 (*)    All other components within normal limits    EKG EKG Interpretation  Date/Time:  Friday February 13 2021 13:31:51 EST Ventricular Rate:  74 PR Interval:  153 QRS Duration: 102 QT Interval:  414 QTC Calculation: 460 R Axis:   44 Text Interpretation: Sinus rhythm Borderline low voltage, extremity leads Minimal ST depression, lateral leads Confirmed by Linwood Dibbles [id number]) on 02/13/2021 1:35:16 PM  Radiology DG Thoracic Spine 2 View  Result Date: 02/13/2021 CLINICAL DATA:  Thoracic spine pain after fall. EXAM: THORACIC SPINE 2 VIEWS COMPARISON:  None. FINDINGS: There is no evidence of thoracic spine fracture. Alignment is normal. No other significant bone abnormalities are identified. IMPRESSION: Negative. Electronically Signed   By: Lupita Raider M.D.   On: 02/13/2021 14:39   DG Lumbar Spine  Complete  Result Date: 02/13/2021 CLINICAL DATA:  Low back pain after fall 4 days ago. EXAM: LUMBAR SPINE - COMPLETE 4+ VIEW COMPARISON:  None. FINDINGS: No definite fracture is noted. Minimal grade 1 anterolisthesis of L4-5 is noted secondary to posterior facet joint hypertrophy. Mild degenerative disc disease is noted at all levels of the lumbar spine. Calcified aortic aneurysm is noted. IMPRESSION: Mild multilevel degenerative disc disease. No acute  abnormality is noted. Probable calcified abdominal aortic aneurysm is noted. Ultrasound is recommended for further evaluation. Electronically Signed   By: Lupita Raider M.D.   On: 02/13/2021 14:43   CT PELVIS WO CONTRAST  Result Date: 02/13/2021 CLINICAL DATA:  Right hip pain.  Fell 4 days ago. EXAM: CT PELVIS WITHOUT CONTRAST TECHNIQUE: Multidetector CT imaging of the pelvis was performed following the standard protocol without intravenous contrast. COMPARISON:  Bilateral hip x-rays from same day. FINDINGS: Urinary Tract:  No abnormality visualized. Bowel:  Severe sigmoid diverticulosis.  Normal appendix. Vascular/Lymphatic: Aortoiliac atherosclerotic vascular disease. No enlarged pelvic lymph nodes. Reproductive:  Uterus and adnexa are unremarkable. Other:  None. Musculoskeletal: Acute bilateral nondisplaced sacral ala fractures containing a small amount of air. Additional nondisplaced fracture at the anterior aspect of the S1-S2 junction. Acute minimally displaced fractures of the left superior and inferior pubic rami. No proximal femur fracture. IMPRESSION: 1. Acute nondisplaced bilateral sacral fractures and minimally displaced left pubic rami fractures. 2. Aortic Atherosclerosis (ICD10-I70.0). Electronically Signed   By: Obie Dredge M.D.   On: 02/13/2021 16:38   US AORTA  Result Date: 02/13/2021 CLINICAL DATA:  Abdominal aortic aneurysm seen on x-ray EXAM: ULTRASOUND OF ABDOMINAL AORTA TECHNIQUE: Ultrasound examination of the abdominal aorta  and proximal common iliac arteries was performed to evaluate for aneurysm. Additional color and Doppler images of the distal aorta were obtained to document patency. COMPARISON:  None. FINDINGS: Abdominal aortic measurements as follows: Proximal:  2.8 x 2.9 cm Mid:  2.9 x 2.7 cm Distal:  3.1 x 3.8 cm Patent: Yes, peak systolic velocity is 107 cm/s Right common iliac artery: 1.3 x 1.3 cm Left common iliac artery: 1.3 x 1.3 cm IMPRESSION: 1. 3.8 cm infrarenal abdominal aortic aneurysm. Recommend follow-up every 2 years. This recommendation follows ACR consensus guidelines: White Paper of the ACR Incidental Findings Committee II on Vascular Findings. J Am Coll Radiol 2013; 10:789-794. Electronically Signed   By: Obie Dredge M.D.   On: 02/13/2021 16:45   DG HIP UNILAT WITH PELVIS 2-3 VIEWS LEFT  Result Date: 02/13/2021 CLINICAL DATA:  Right hip pain.  Fell 4 days ago. EXAM: DG HIP (WITH OR WITHOUT PELVIS) 2-3V RIGHT; DG HIP (WITH OR WITHOUT PELVIS) 2-3V LEFT COMPARISON:  None. FINDINGS: Mild cortical irregularity of the left parasymphyseal superior pubic ramus, suspicious for nondisplaced fracture. No additional fracture. No dislocation. Hip joint spaces are preserved. The pubic symphysis and sacroiliac joints are intact. Osteopenia. Soft tissues are unremarkable. IMPRESSION: 1. Probable nondisplaced left parasymphyseal superior pubic ramus fracture. No proximal femur fracture. Electronically Signed   By: Obie Dredge M.D.   On: 02/13/2021 14:45   DG HIP UNILAT WITH PELVIS 2-3 VIEWS RIGHT  Result Date: 02/13/2021 CLINICAL DATA:  Right hip pain.  Fell 4 days ago. EXAM: DG HIP (WITH OR WITHOUT PELVIS) 2-3V RIGHT; DG HIP (WITH OR WITHOUT PELVIS) 2-3V LEFT COMPARISON:  None. FINDINGS: Mild cortical irregularity of the left parasymphyseal superior pubic ramus, suspicious for nondisplaced fracture. No additional fracture. No dislocation. Hip joint spaces are preserved. The pubic symphysis and sacroiliac  joints are intact. Osteopenia. Soft tissues are unremarkable. IMPRESSION: 1. Probable nondisplaced left parasymphyseal superior pubic ramus fracture. No proximal femur fracture. Electronically Signed   By: Obie Dredge M.D.   On: 02/13/2021 14:45    Procedures Procedures   Medications Ordered in ED Medications  potassium chloride SA (KLOR-CON M) CR tablet 40 mEq (40 mEq Oral Given 02/13/21 1641)    ED  Course  I have reviewed the triage vital signs and the nursing notes.  Pertinent labs & imaging results that were available during my care of the patient were reviewed by me and considered in my medical decision making (see chart for details).  Clinical Course as of 02/14/21 1106  Fri Feb 13, 2021  1512 Thoracic spine films negative [JK]  1513 Lumbar spine film negative for acute process.  Probable calcified aortic aneurysm noted incidentally [JK]  1513 Probable superior pubic ramus fracture [JK]    Clinical Course User Index [JK] Linwood Dibbles, MD   MDM Rules/Calculators/A&P                         Patient presented to the ED for evaluation of a fall that occurred several days ago.  Patient has been ambulatory since the injury.  On exam primarily having pain in the pelvic and hip region.  Patient denies any other systemic symptoms.  She was not having abdominal pain.  Her fall was mechanical fall and not a syncopal episode.  Initial films suggest the possibility of a pubic rami fracture.  There is also incidental finding suggestive of abdominal aortic aneurysm which patient was not aware of.  Abdominal ultrasound ordered to evaluate further regarding the possible aneurysm although she is not having any symptoms to suggest any acute issue with that.  CT scan also ordered to evaluate for the probable pelvic fracture.  Care transferred to Dr. Criss Alvine at shift change.    Final Clinical Impression(s) / ED Diagnoses Final diagnoses:  Closed fracture of multiple pubic rami, left, initial  encounter (HCC)  Closed fracture of sacrum and coccyx, initial encounter (HCC)  Infrarenal abdominal aortic aneurysm (AAA) without rupture  Hypokalemia    Rx / DC Orders ED Discharge Orders          Ordered    potassium chloride SA (KLOR-CON M) 20 MEQ tablet  2 times daily        02/13/21 1712    HYDROcodone-acetaminophen (NORCO) 5-325 MG tablet  Every 6 hours PRN        02/13/21 1712             Linwood Dibbles, MD 02/14/21 1108

## 2021-02-13 NOTE — ED Notes (Signed)
Purewick placed on patient.

## 2021-02-13 NOTE — Discharge Instructions (Addendum)
Your CT scan shows a fracture of your left side of your pelvis as well as your sacrum on both sides.  You may walk with a walker and weight-bear as tolerated.  Take ibuprofen and/or Tylenol for pain and take the hydrocodone for breakthrough pain.  Follow-up with the orthopedist.  Otherwise you have an abdominal aortic aneurysm.  This will need to be monitored by repeat ultrasounds.  If you develop severe abdominal pain then return to the ER.

## 2021-02-13 NOTE — ED Notes (Signed)
Patient transported to x-ray. ?

## 2023-01-17 IMAGING — CR DG THORACIC SPINE 2V
3 series · 3 of 3 positions shown · non-contrast
Comparison: None.

CLINICAL DATA: Thoracic spine pain after fall.

EXAM:
THORACIC SPINE 2 VIEWS

[t-spine ap]
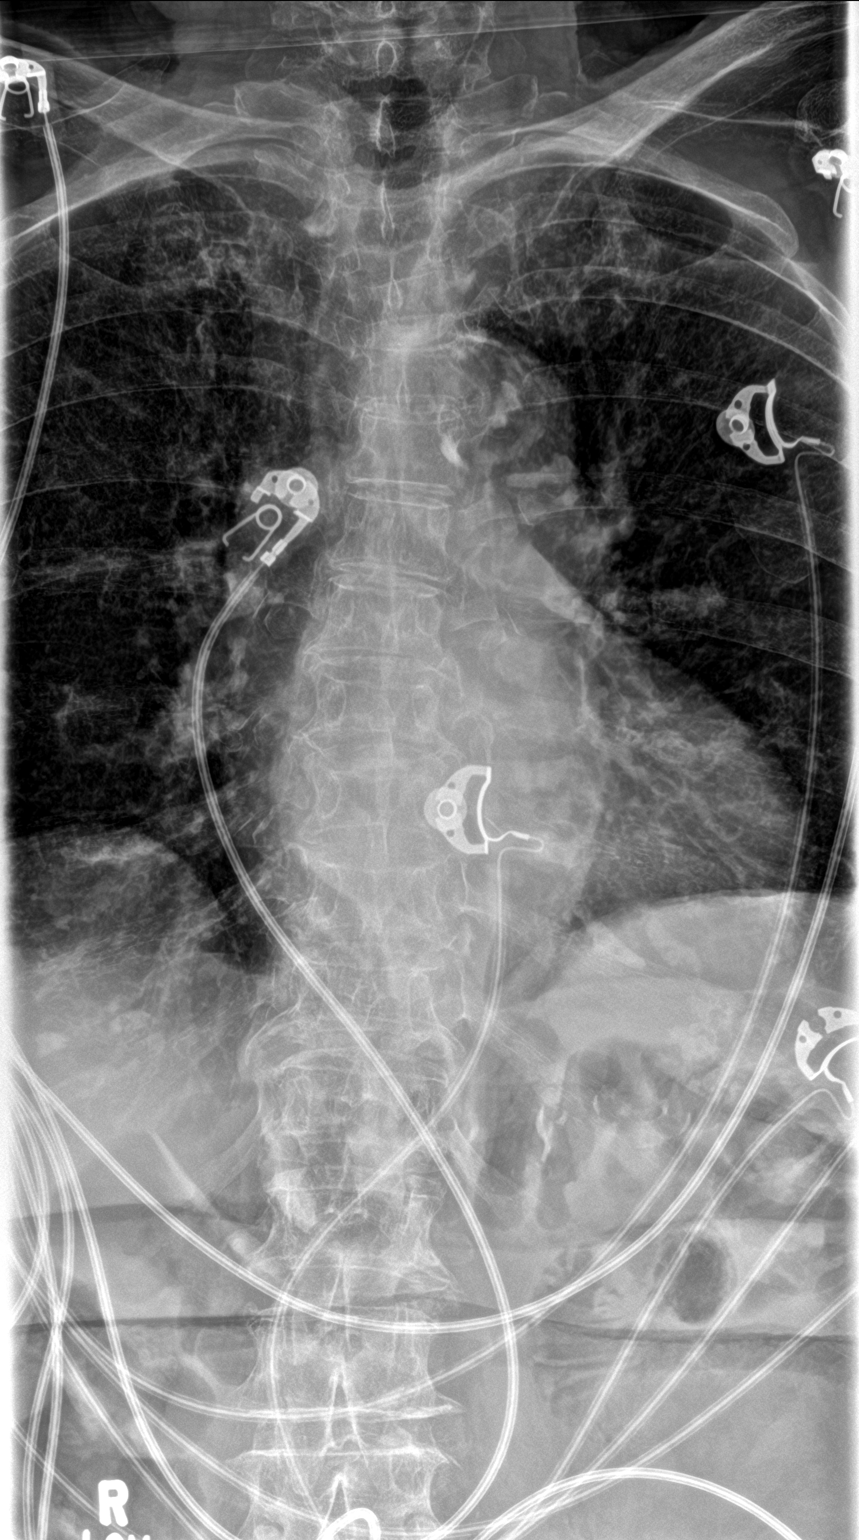

[t-spine lat]
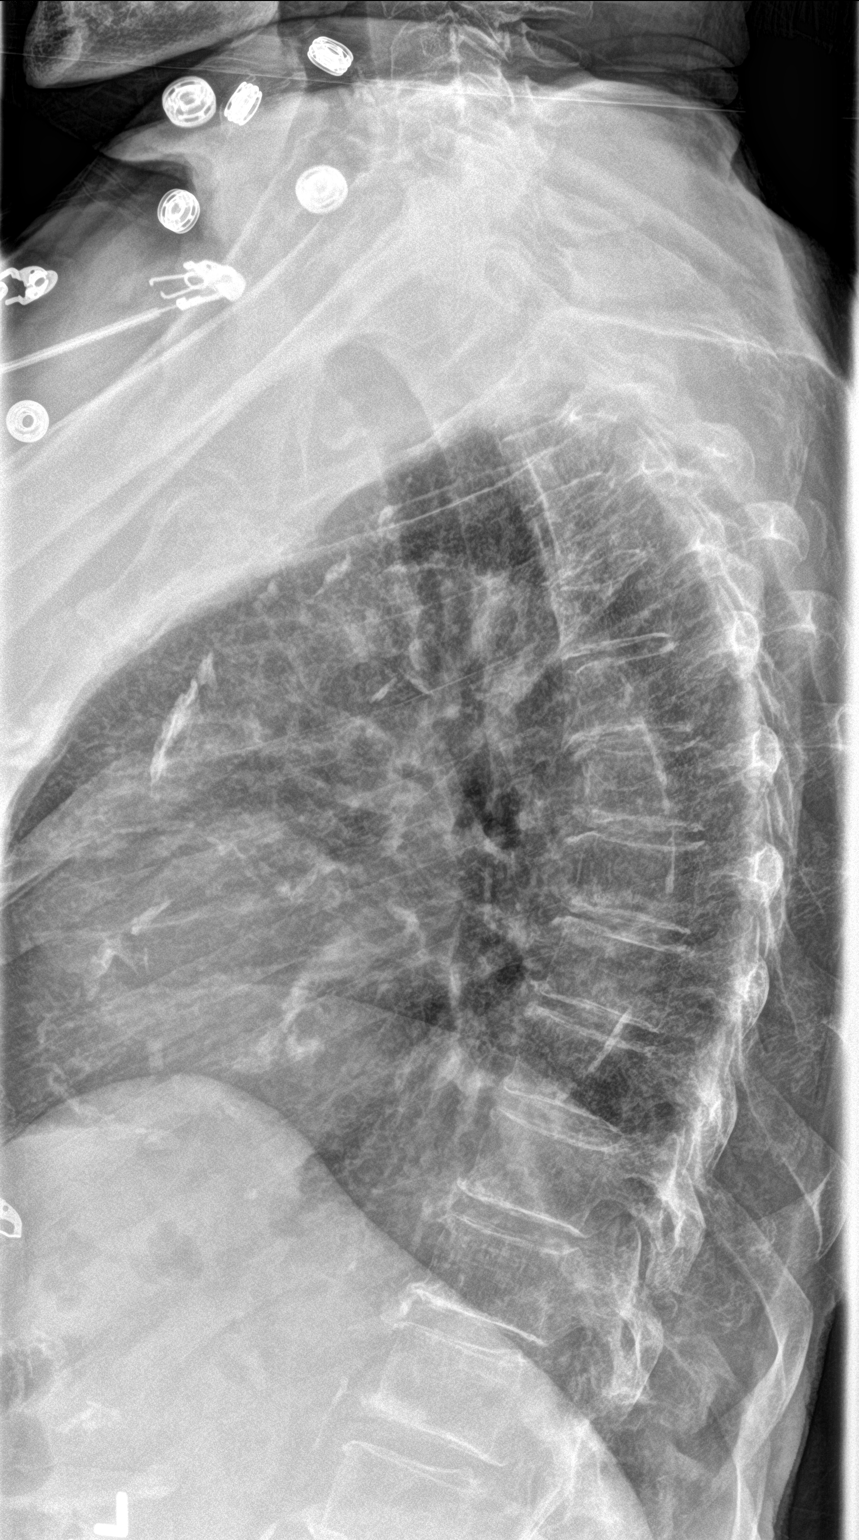

[t-spine swimmers]
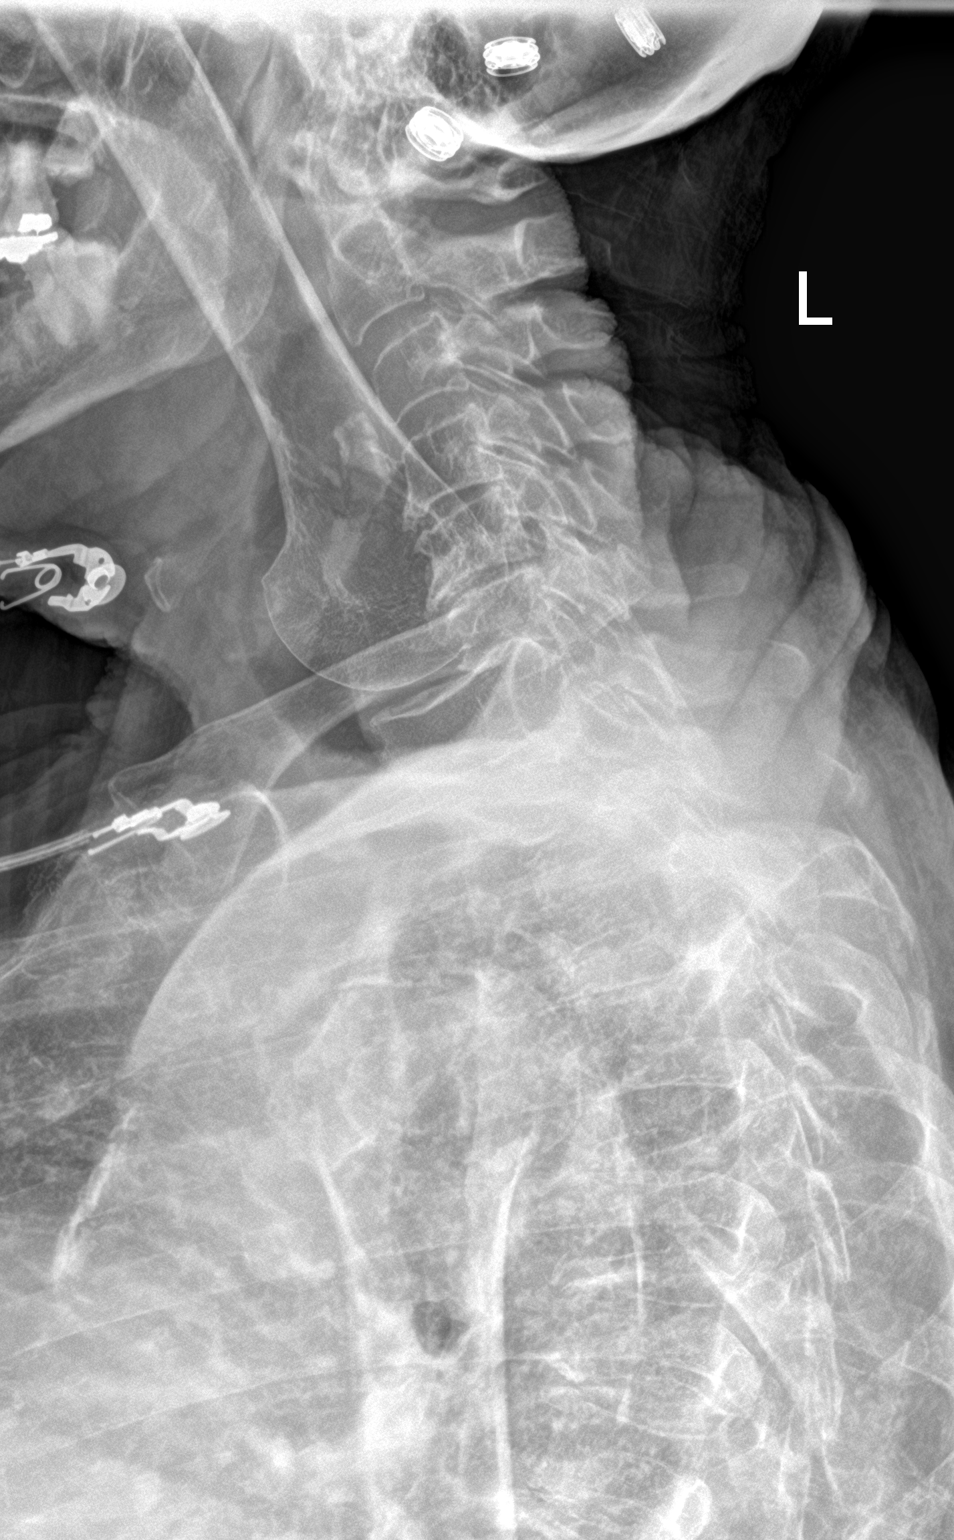

[3 of 3 positions shown; findings below may reference images not displayed]

FINDINGS: There is no evidence of thoracic spine fracture. Alignment is
normal. No other significant bone abnormalities are identified.
IMPRESSION: Negative.

## 2023-01-17 IMAGING — CT CT PELVIS W/O CM
2 of 4 series · 15 of 46 positions shown, 17 images · non-contrast
Comparison: Bilateral hip x-rays from same day.

CLINICAL DATA: Right hip pain.  Fell 4 days ago.

EXAM:
CT PELVIS WITHOUT CONTRAST
TECHNIQUE: Multidetector CT imaging of the pelvis was performed following the
standard protocol without intravenous contrast.

[Series 6: pelvis thin · axial · 0.94mm/px · z∈[+788,+1033]mm · 12 of 447 slices shown, 14 images]
[im 19/447  soft-tissue]
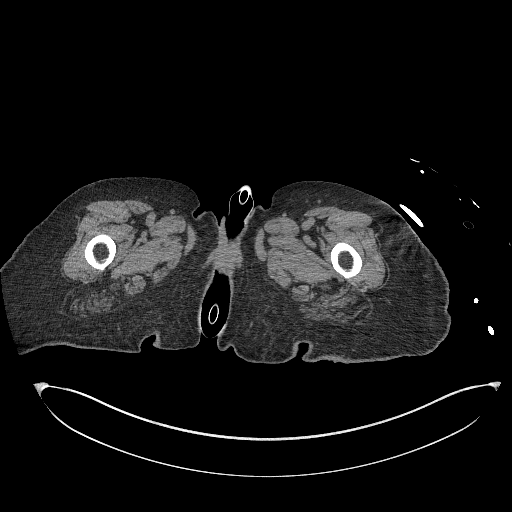
[im 19/447  bone]
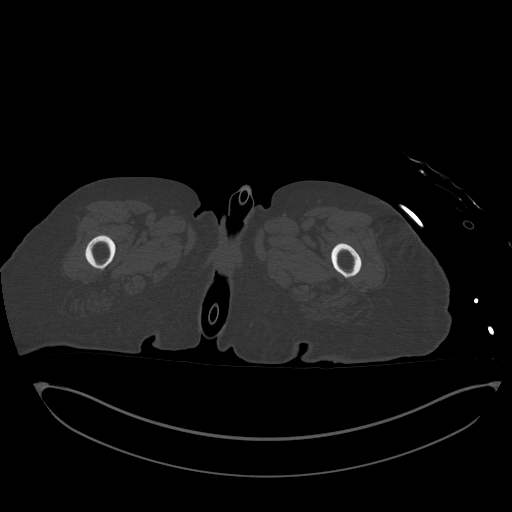
[im 56/447  soft-tissue]
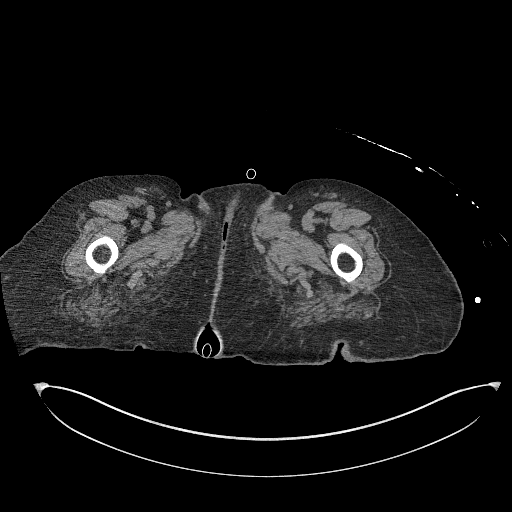
[im 93/447  soft-tissue]
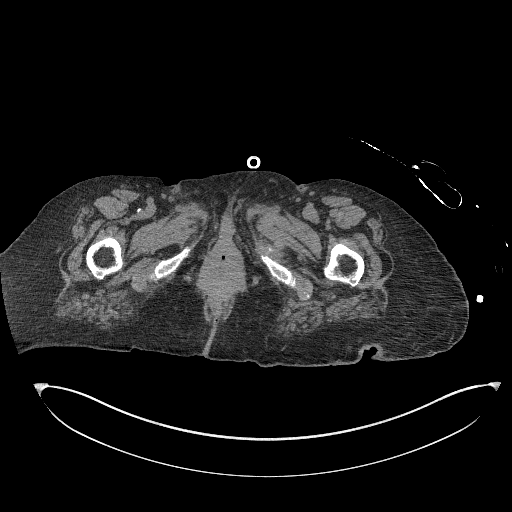
[im 131/447  soft-tissue]
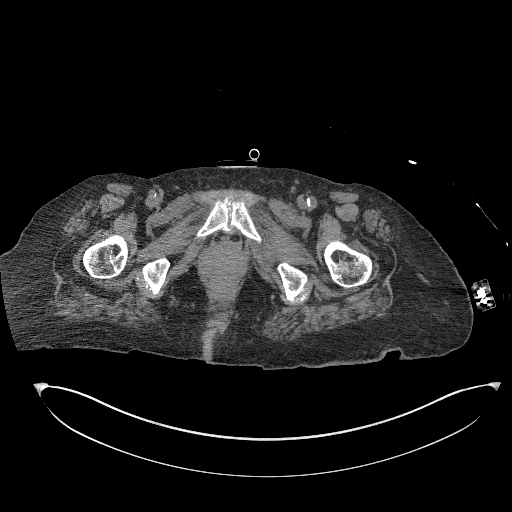
[im 168/447  soft-tissue]
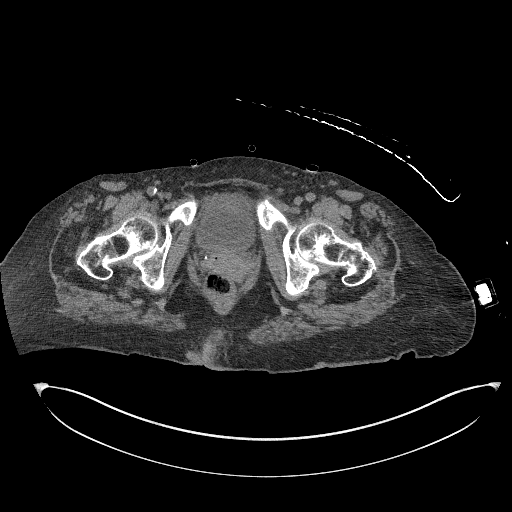
[im 205/447  soft-tissue]
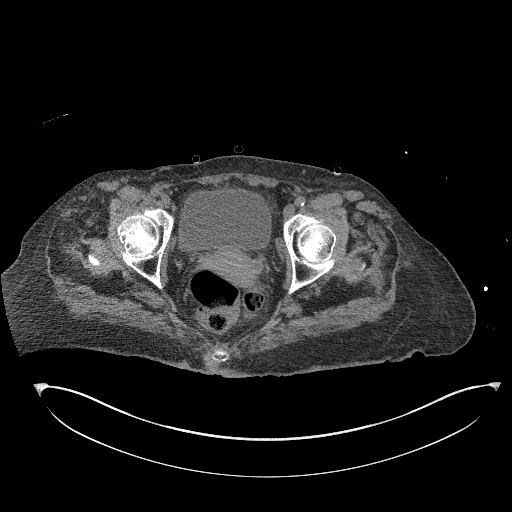
[im 242/447  soft-tissue]
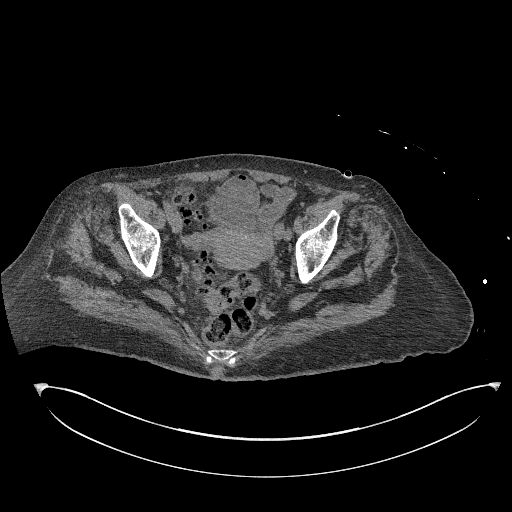
[im 279/447  soft-tissue]
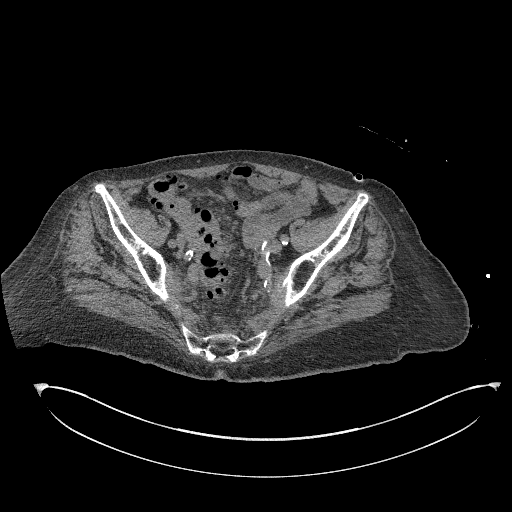
[im 316/447  soft-tissue]
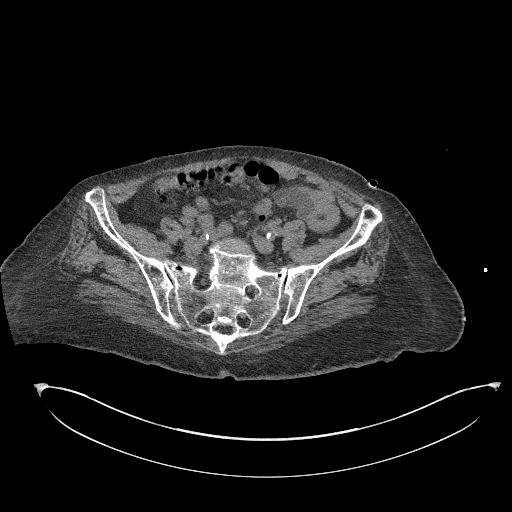
[im 316/447  bone]
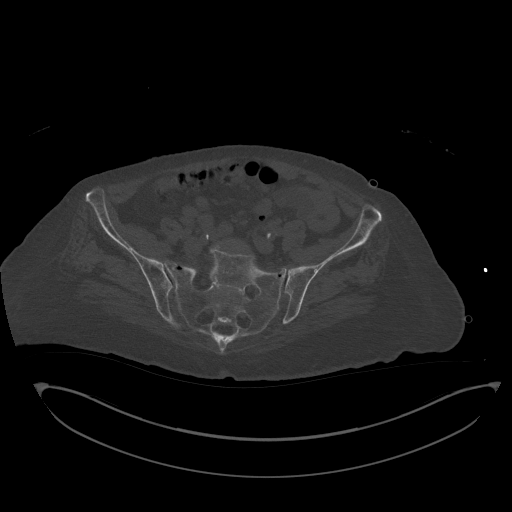
[im 354/447  soft-tissue]
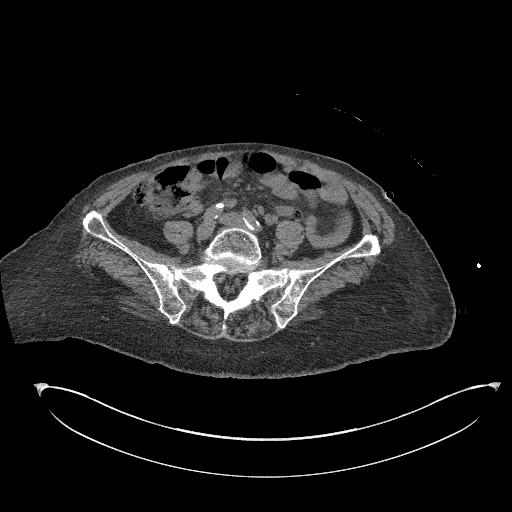
[im 391/447  soft-tissue]
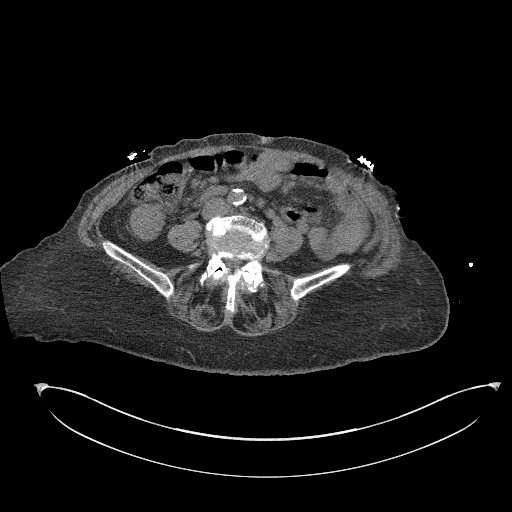
[im 428/447  soft-tissue]
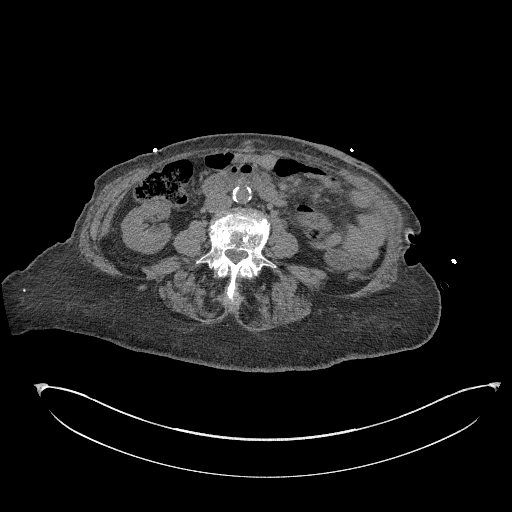

[Series 8: coronal st · coronal · 0.56mm/px · 3 of 128 slices shown]
[im 43/128  soft-tissue]
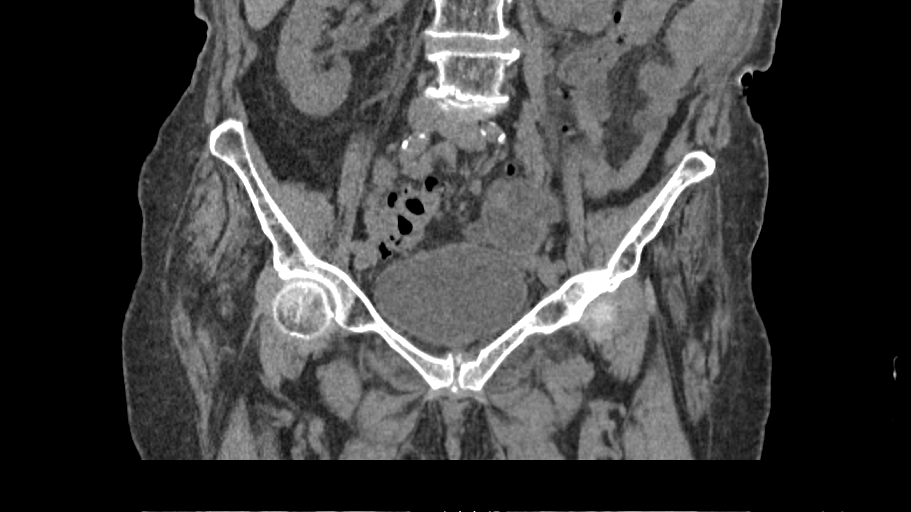
[im 57/128  soft-tissue]
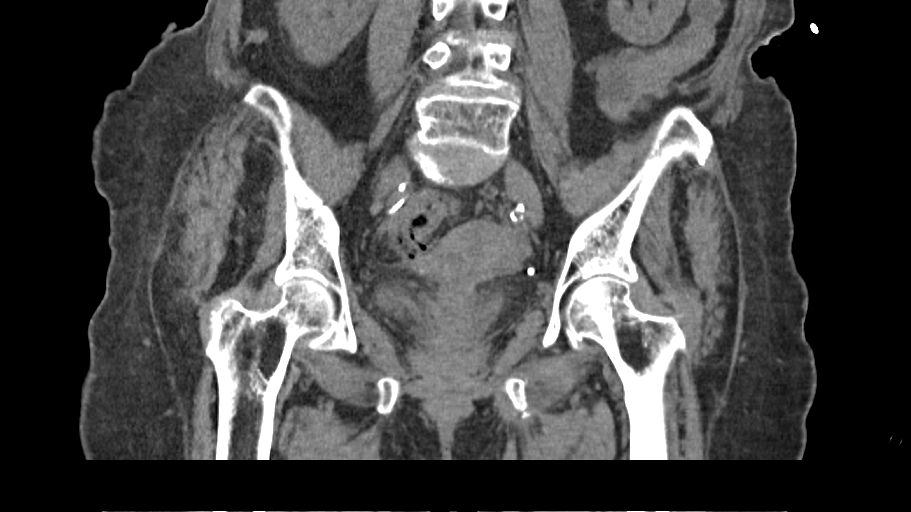
[im 71/128  soft-tissue]
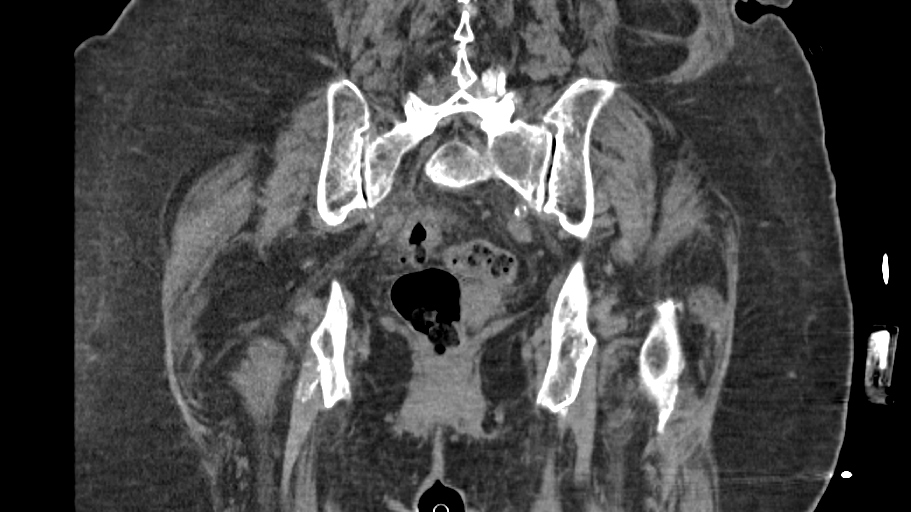

[15 of 46 positions shown; findings below may reference images not displayed]

FINDINGS: Urinary Tract:  No abnormality visualized.

Bowel:  Severe sigmoid diverticulosis.  Normal appendix.

Vascular/Lymphatic: Aortoiliac atherosclerotic vascular disease. No
enlarged pelvic lymph nodes.

Reproductive:  Uterus and adnexa are unremarkable.

Other:  None.

Musculoskeletal: Acute bilateral nondisplaced sacral ala fractures
containing a small amount of air. Additional nondisplaced fracture
at the anterior aspect of the S1-S2 junction. Acute minimally
displaced fractures of the left superior and inferior pubic rami. No
proximal femur fracture.
IMPRESSION: 1. Acute nondisplaced bilateral sacral fractures and minimally
displaced left pubic rami fractures.
2. Aortic Atherosclerosis ([id number]).

## 2023-07-11 ENCOUNTER — Encounter (HOSPITAL_COMMUNITY): Payer: Self-pay | Admitting: Hospitalist

## 2023-07-11 ENCOUNTER — Other Ambulatory Visit: Payer: Self-pay

## 2023-07-11 ENCOUNTER — Observation Stay (HOSPITAL_COMMUNITY)
Admission: EM | Admit: 2023-07-11 | Discharge: 2023-07-14 | Disposition: A | Discharge status: Home / Self Care | Attending: Internal Medicine | Admitting: Internal Medicine

## 2023-07-11 ENCOUNTER — Observation Stay (HOSPITAL_COMMUNITY)

## 2023-07-11 ENCOUNTER — Emergency Department (HOSPITAL_COMMUNITY)

## 2023-07-11 DIAGNOSIS — N3000 Acute cystitis without hematuria: Principal | ICD-10-CM | POA: Insufficient documentation

## 2023-07-11 DIAGNOSIS — Z79899 Other long term (current) drug therapy: Secondary | ICD-10-CM | POA: Insufficient documentation

## 2023-07-11 DIAGNOSIS — F17210 Nicotine dependence, cigarettes, uncomplicated: Secondary | ICD-10-CM | POA: Diagnosis not present

## 2023-07-11 DIAGNOSIS — R2681 Unsteadiness on feet: Secondary | ICD-10-CM | POA: Insufficient documentation

## 2023-07-11 DIAGNOSIS — E876 Hypokalemia: Secondary | ICD-10-CM | POA: Diagnosis not present

## 2023-07-11 DIAGNOSIS — M549 Dorsalgia, unspecified: Secondary | ICD-10-CM | POA: Diagnosis present

## 2023-07-11 DIAGNOSIS — R531 Weakness: Secondary | ICD-10-CM | POA: Diagnosis not present

## 2023-07-11 DIAGNOSIS — F0390 Unspecified dementia without behavioral disturbance: Secondary | ICD-10-CM | POA: Diagnosis not present

## 2023-07-11 DIAGNOSIS — L89159 Pressure ulcer of sacral region, unspecified stage: Secondary | ICD-10-CM | POA: Insufficient documentation

## 2023-07-11 DIAGNOSIS — N390 Urinary tract infection, site not specified: Principal | ICD-10-CM | POA: Insufficient documentation

## 2023-07-11 DIAGNOSIS — L8990 Pressure ulcer of unspecified site, unspecified stage: Secondary | ICD-10-CM | POA: Insufficient documentation

## 2023-07-11 DIAGNOSIS — E785 Hyperlipidemia, unspecified: Secondary | ICD-10-CM | POA: Insufficient documentation

## 2023-07-11 DIAGNOSIS — I10 Essential (primary) hypertension: Secondary | ICD-10-CM | POA: Insufficient documentation

## 2023-07-11 LAB — BLOOD GAS, VENOUS
Acid-Base Excess: 2.8 mmol/L — ABNORMAL HIGH (ref 0.0–2.0)
Bicarbonate: 28.5 mmol/L — ABNORMAL HIGH (ref 20.0–28.0)
O2 Saturation: 25.7 %
Patient temperature: 37
pCO2, Ven: 47 mmHg (ref 44–60)
pH, Ven: 7.39 (ref 7.25–7.43)
pO2, Ven: <31 mmHg — CL (ref 32–45)

## 2023-07-11 LAB — COMPREHENSIVE METABOLIC PANEL WITH GFR
ALT: 9 U/L (ref 0–44)
AST: 13 U/L — ABNORMAL LOW (ref 15–41)
Albumin: 3.7 g/dL (ref 3.5–5.0)
Alkaline Phosphatase: 81 U/L (ref 38–126)
Anion gap: 12 (ref 5–15)
BUN: 13 mg/dL (ref 8–23)
CO2: 24 mmol/L (ref 22–32)
Calcium: 8.7 mg/dL — ABNORMAL LOW (ref 8.9–10.3)
Chloride: 103 mmol/L (ref 98–111)
Creatinine, Ser: 0.6 mg/dL (ref 0.44–1.00)
GFR, Estimated: >60 mL/min (ref >60)
Glucose, Bld: 86 mg/dL (ref 70–99)
Potassium: 3.1 mmol/L — ABNORMAL LOW (ref 3.5–5.1)
Sodium: 139 mmol/L (ref 135–145)
Total Bilirubin: 1.5 mg/dL — ABNORMAL HIGH (ref 0.0–1.2)
Total Protein: 7.6 g/dL (ref 6.5–8.1)

## 2023-07-11 LAB — URINALYSIS, W/ REFLEX TO CULTURE (INFECTION SUSPECTED)
Bilirubin Urine: NEGATIVE
Glucose, UA: NEGATIVE mg/dL
Ketones, ur: 20 mg/dL — AB
Nitrite: POSITIVE — AB
Protein, ur: NEGATIVE mg/dL
Specific Gravity, Urine: 1.012 (ref 1.005–1.030)
pH: 5 (ref 5.0–8.0)

## 2023-07-11 LAB — I-STAT CHEM 8, ED
BUN: 13 mg/dL (ref 8–23)
Calcium, Ion: 1.1 mmol/L — ABNORMAL LOW (ref 1.15–1.40)
Chloride: 102 mmol/L (ref 98–111)
Creatinine, Ser: 0.7 mg/dL (ref 0.44–1.00)
Glucose, Bld: 84 mg/dL (ref 70–99)
HCT: 50 % — ABNORMAL HIGH (ref 36.0–46.0)
Hemoglobin: 17 g/dL — ABNORMAL HIGH (ref 12.0–15.0)
Potassium: 3.6 mmol/L (ref 3.5–5.1)
Sodium: 140 mmol/L (ref 135–145)
TCO2: 25 mmol/L (ref 22–32)

## 2023-07-11 LAB — CBC
HCT: 50.3 % — ABNORMAL HIGH (ref 36.0–46.0)
HCT: 49.4 % — ABNORMAL HIGH (ref 36.0–46.0)
Hemoglobin: 16.5 g/dL — ABNORMAL HIGH (ref 12.0–15.0)
Hemoglobin: 15.9 g/dL — ABNORMAL HIGH (ref 12.0–15.0)
MCH: 30.1 pg (ref 26.0–34.0)
MCH: 29.5 pg (ref 26.0–34.0)
MCHC: 32.8 g/dL (ref 30.0–36.0)
MCHC: 32.2 g/dL (ref 30.0–36.0)
MCV: 91.8 fL (ref 80.0–100.0)
MCV: 91.7 fL (ref 80.0–100.0)
Platelets: 245 10*3/uL (ref 150–400)
Platelets: 247 10*3/uL (ref 150–400)
RBC: 5.48 MIL/uL — ABNORMAL HIGH (ref 3.87–5.11)
RBC: 5.39 MIL/uL — ABNORMAL HIGH (ref 3.87–5.11)
RDW: 14.2 % (ref 11.5–15.5)
RDW: 14.3 % (ref 11.5–15.5)
WBC: 7.4 10*3/uL (ref 4.0–10.5)
WBC: 7.5 10*3/uL (ref 4.0–10.5)
nRBC: 0 % (ref 0.0–0.2)
nRBC: 0 % (ref 0.0–0.2)

## 2023-07-11 LAB — CREATININE, SERUM
Creatinine, Ser: 0.74 mg/dL (ref 0.44–1.00)
GFR, Estimated: >60 mL/min (ref >60)

## 2023-07-11 LAB — ETHANOL: Alcohol, Ethyl (B): <15 mg/dL (ref <15)

## 2023-07-11 MED ORDER — ACETAMINOPHEN 325 MG PO TABS: 650.0000 mg | ORAL_TABLET | Freq: Four times a day (QID) | ORAL | Status: DC | PRN

## 2023-07-11 MED ORDER — ACETAMINOPHEN 650 MG RE SUPP: 650.0000 mg | Freq: Four times a day (QID) | RECTAL | Status: DC | PRN

## 2023-07-11 MED ORDER — SODIUM CHLORIDE (PF) 0.9 % IJ SOLN
INTRAMUSCULAR | Status: AC
  Filled 2023-07-11: qty 50

## 2023-07-11 MED ORDER — LISINOPRIL 10 MG PO TABS
10.0000 mg | ORAL_TABLET | Freq: Every day | ORAL | Status: DC
  Administered 2023-07-11 – 2023-07-14 (×3): 10 mg via ORAL
  Filled 2023-07-11 (×4): qty 1

## 2023-07-11 MED ORDER — ENOXAPARIN SODIUM 40 MG/0.4ML IJ SOSY
40.0000 mg | PREFILLED_SYRINGE | INTRAMUSCULAR | Status: DC
  Administered 2023-07-11: 40 mg via SUBCUTANEOUS
  Filled 2023-07-11: qty 0.4

## 2023-07-11 MED ORDER — SIMVASTATIN 20 MG PO TABS
40.0000 mg | ORAL_TABLET | Freq: Every day | ORAL | Status: DC
  Administered 2023-07-11 – 2023-07-13 (×3): 40 mg via ORAL
  Filled 2023-07-11 (×3): qty 2

## 2023-07-11 MED ORDER — SODIUM CHLORIDE 0.9 % IV SOLN
1.0000 g | Freq: Once | INTRAVENOUS | Status: AC
  Administered 2023-07-11: 1 g via INTRAVENOUS
  Filled 2023-07-11: qty 10

## 2023-07-11 MED ORDER — POLYETHYLENE GLYCOL 3350 17 G PO PACK: 17.0000 g | PACK | Freq: Every day | ORAL | Status: DC | PRN

## 2023-07-11 MED ORDER — SODIUM CHLORIDE 0.9 % IV SOLN
1.0000 g | INTRAVENOUS | Status: DC
  Administered 2023-07-12 – 2023-07-13 (×2): 1 g via INTRAVENOUS
  Filled 2023-07-11 (×2): qty 10

## 2023-07-11 MED ORDER — MELATONIN 5 MG PO TABS
5.0000 mg | ORAL_TABLET | Freq: Every evening | ORAL | Status: AC | PRN
  Administered 2023-07-11 – 2023-07-12 (×2): 5 mg via ORAL
  Filled 2023-07-11 (×3): qty 1

## 2023-07-11 MED ORDER — IOHEXOL 300 MG/ML  SOLN
100.0000 mL | Freq: Once | INTRAMUSCULAR | Status: AC | PRN
  Administered 2023-07-11: 100 mL via INTRAVENOUS

## 2023-07-11 NOTE — ED Triage Notes (Signed)
 Pt BIBA from home for unwitnessed fall around 5pm yesterday.  Family heard the fall, pt did was ok but this morning she was lethargic and was in pain.  Per EMS strong urine smell and family reports pt has hx of dementia and last 3 nights she has not slept.   No Loss of consciousness or blood thinners with limited medical hx. Knot on head is preexisting but has not been medically evaluated.

## 2023-07-11 NOTE — ED Provider Notes (Signed)
 [place] EMERGENCY DEPARTMENT AT [facility] Provider Note   CSN: [id number] Arrival date & time: 07/11/23  1222     History {Add pertinent medical, surgical, social history, OB history to HPI:1} Chief Complaint  Patient presents with   Gabrielle Gonzales is a 83 y.o. female.   Fall   Patient has a history of hypertension hyperlipidemia.  She also has history of previous falls.  Family states she has been having issues with her memory.  Patient does not like to go see a doctor.  Last doctor visit in the electronic medical record was in June 2023.  Patient is supposed to use a walker.  Last evening she had a fall at home.  Patient is not exactly sure why she fell.  Family heard her fall but was able to help her get up.  She did not want to be seen.  This morning they noted that she was complaining of pain in her back and she was also more lethargic.  They had also noticed a urine odor.  Unclear if the patient hit her head or not.  She does have some chronic swelling in the back of her head but this is not new.  No fevers or chills.  No chest pain.  Patient states she does have some pain in her tailbone area.  Patient does smoke cigarettes regularly    Home Medications Prior to Admission medications   Medication Sig Start Date End Date Taking? Authorizing Provider  Calcium Carb-Cholecalciferol [phone] MG-UNIT CAPS Take by mouth.    [provider]  Enalapril -hydroCHLOROthiazide  5-12.5 MG tablet Take 1 tablet by mouth daily. 01/06/18 04/06/18  Sagardia, Miguel Jose, MD  fluticasone (FLONASE) 50 MCG/ACT nasal spray Place into the nose. 04/29/15 04/28/16  [provider]  HYDROcodone -acetaminophen  (NORCO) 5-325 MG tablet Take 1 tablet by mouth every 6 (six) hours as needed. 02/13/21   Jerilynn Montenegro, MD  lisinopril -hydrochlorothiazide  (PRINZIDE ,ZESTORETIC ) 10-12.5 MG tablet Take 1 tablet by mouth daily. 01/17/18   Elvira Hammersmith, MD  PARoxetine   (PAXIL ) 40 MG tablet Take 1 tablet (40 mg total) by mouth every morning. 01/06/18 04/06/18  Elvira Hammersmith, MD  permethrin  (ELIMITE ) 5 % cream Apply to affected area once 04/18/14   Mare Shallow, NP  potassium chloride  SA (KLOR-CON  M) 20 MEQ tablet Take 1 tablet (20 mEq total) by mouth 2 (two) times daily for 5 days. 02/13/21 02/18/21  Jerilynn Montenegro, MD  raloxifene (EVISTA) 60 MG tablet TAKE ONE TABLET BY MOUTH ONCE DAILY 04/24/15   [provider]  simvastatin  (ZOCOR ) 40 MG tablet Take 1 tablet (40 mg total) by mouth daily at 6 PM. 01/06/18 01/06/19  Sagardia, Isidro Margo, MD  triamcinolone cream (KENALOG) 0.1 % APPLY TOPICALLY TWICE DAILY 09/01/15   [provider]      Allergies    Patient has no known allergies.    Review of Systems   Review of Systems  Physical Exam Updated Vital Signs BP (!) 145/108   Pulse 86   Temp 98.7 F (37.1 C) (Oral)   Resp 18   SpO2 94%  Physical Exam Vitals and nursing note reviewed.  Constitutional:      Appearance: She is well-developed. She is not diaphoretic.     Comments: Cigarette burn noted on her sweatshirt  HENT:     Head: Normocephalic and atraumatic.     Right Ear: External ear normal.     Left Ear: External ear normal.  Eyes:     General: No scleral icterus.       Right eye: No discharge.        Left eye: No discharge.     Conjunctiva/sclera: Conjunctivae normal.  Neck:     Trachea: No tracheal deviation.  Cardiovascular:     Rate and Rhythm: Normal rate and regular rhythm.  Pulmonary:     Effort: Pulmonary effort is normal. No respiratory distress.     Breath sounds: Normal breath sounds. No stridor. No wheezing or rales.  Abdominal:     General: Bowel sounds are normal. There is no distension.     Palpations: Abdomen is soft.     Tenderness: There is no abdominal tenderness. There is no guarding or rebound.  Genitourinary:    Comments: Small amount of bruising noted in the sacral region, no skin  breakdown Musculoskeletal:        General: No tenderness or deformity.     Cervical back: Neck supple.     Comments: No tenderness palpation bilateral lower extremities, no tenderness palpation cervical thoracic or lumbar spine, some tenderness of the sacral region  Skin:    General: Skin is warm and dry.     Findings: No rash.  Neurological:     General: No focal deficit present.     Mental Status: She is alert.     Cranial Nerves: No cranial nerve deficit, dysarthria or facial asymmetry.     Sensory: No sensory deficit.     Motor: No weakness, abnormal muscle tone or seizure activity.     Coordination: Coordination normal.     Comments: Answering questions appropriately following commands  Psychiatric:        Mood and Affect: Mood normal.     ED Results / Procedures / Treatments   Labs (all labs ordered are listed, but only abnormal results are displayed) Labs Reviewed  CBC - Abnormal; Notable for the following components:      Result Value   RBC 5.48 (*)    Hemoglobin 16.5 (*)    HCT 50.3 (*)    All other components within normal limits  COMPREHENSIVE METABOLIC PANEL WITH GFR - Abnormal; Notable for the following components:   Potassium 3.1 (*)    Calcium 8.7 (*)    AST 13 (*)    Total Bilirubin 1.5 (*)    All other components within normal limits  URINALYSIS, W/ REFLEX TO CULTURE (INFECTION SUSPECTED) - Abnormal; Notable for the following components:   APPearance HAZY (*)    Hgb urine dipstick SMALL (*)    Ketones, ur 20 (*)    Nitrite POSITIVE (*)    Leukocytes,Ua TRACE (*)    Bacteria, UA MANY (*)    All other components within normal limits  BLOOD GAS, VENOUS - Abnormal; Notable for the following components:   pO2, Ven <31 (*)    Bicarbonate 28.5 (*)    Acid-Base Excess 2.8 (*)    All other components within normal limits  I-STAT CHEM 8, ED - Abnormal; Notable for the following components:   Calcium, Ion 1.10 (*)    Hemoglobin 17.0 (*)    HCT 50.0 (*)     All other components within normal limits  ETHANOL    EKG EKG Interpretation Date/Time:  Monday Jul 11 2023 12:38:42 EDT Ventricular Rate:  79 PR Interval:  159 QRS Duration:  99 QT Interval:  320 QTC Calculation: 367 R Axis:   11  Text Interpretation: Sinus rhythm Atrial  premature complex Consider anterior infarct Minimal ST depression, lateral leads No significant change since last tracing Confirmed by Trish Furl [phone]) on 07/11/2023 12:52:30 PM  Radiology CT ABDOMEN PELVIS W CONTRAST Result Date: 07/11/2023 CLINICAL DATA:  Unwitnessed fall today.  Lower abdominal pain. EXAM: CT ABDOMEN AND PELVIS WITH CONTRAST TECHNIQUE: Multidetector CT imaging of the abdomen and pelvis was performed using the standard protocol following bolus administration of intravenous contrast. RADIATION DOSE REDUCTION: This exam was performed according to the departmental dose-optimization program which includes automated exposure control, adjustment of the mA and/or kV according to patient size and/or use of iterative reconstruction technique. CONTRAST:  100mL OMNIPAQUE IOHEXOL 300 MG/ML  SOLN COMPARISON:  None Available. FINDINGS: Lower chest:  Mild dependent atelectasis in both lung bases. Hepatobiliary: No hepatic laceration or mass identified. No evidence of acute cholecystitis or biliary obstruction. Pancreas: No parenchymal laceration, mass, or inflammatory changes identified. Spleen: No evidence of splenic laceration. Adrenal/Urinary Tract: No hemorrhage or parenchymal lacerations identified. No evidence of suspicious renal masses or hydronephrosis. Mild diffuse bladder wall thickening again seen, most likely due to cystitis. Stomach/Bowel: Unopacified bowel loops are unremarkable in appearance. No evidence of hemoperitoneum. Large stool burden seen throughout the colon. Diverticulosis is seen mainly involving the sigmoid colon, however there is no evidence of diverticulitis. Vascular/Lymphatic: No evidence of  abdominal aortic injury. 4.0 cm saccular infrarenal abdominal aortic aneurysm noted. No evidence of aneurysm leak or rupture. No pathologically enlarged lymph nodes identified. Reproductive:  No mass or other significant abnormality identified. Other:  None. Musculoskeletal: No acute fractures or suspicious bone lesions identified. Old fracture deformity of the left ischial tuberosity again noted. IMPRESSION: No evidence of acute traumatic injury. Mild diffuse bladder wall thickening, likely due to cystitis. Colonic diverticulosis, without radiographic evidence of diverticulitis. Large stool burden noted; recommend clinical correlation for possible constipation. 4.0 cm saccular infrarenal abdominal aortic aneurysm. Recommend follow-up CT or MR as appropriate in 12 months and referral to or continued care with vascular specialist. (Ref.: J Vasc Surg. 2018; 67:2-77 and J Am Coll Radiol 2013;10(10):789-794.) Electronically Signed   By: Marlyce Sine M.D.   On: 07/11/2023 15:43   CT L-SPINE NO CHARGE Result Date: 07/11/2023 CLINICAL DATA:  Unwitnessed fall. EXAM: CT LUMBAR SPINE WITHOUT CONTRAST TECHNIQUE: Multidetector CT imaging of the lumbar spine was performed without intravenous contrast administration. Multiplanar CT image reconstructions were also generated. RADIATION DOSE REDUCTION: This exam was performed according to the departmental dose-optimization program which includes automated exposure control, adjustment of the mA and/or kV according to patient size and/or use of iterative reconstruction technique. COMPARISON:  Included portion from pelvic CT 02/13/2021 FINDINGS: Segmentation: 5 lumbar type vertebrae. Alignment: Minor anterolisthesis of L4 on L5. Vertebrae: L3 inferior endplate Schmorl's node, new from 2022. No significant loss of height. Posterior elements are intact. Paraspinal and other soft tissues: Concurrent abdominopelvic CT for complete assessment. There is no paraspinal hematoma. Disc  levels: Mild broad-based disc bulge at L2-L3. Moderate broad-based disc bulge at L3-L4 and L4-L5 with ligamentum flavum hypertrophy creates central canal stenosis. Moderate L5-S1 facet hypertrophy. IMPRESSION: 1. L3 inferior endplate Schmorl's node, new from 2022 but age indeterminate. No significant loss of height. 2. Multilevel degenerative disc disease and facet hypertrophy. Electronically Signed   By: Chadwick Colonel M.D.   On: 07/11/2023 15:39   CT Cervical Spine Wo Contrast Result Date: 07/11/2023 CLINICAL DATA:  Neck trauma, unwitnessed fall. EXAM: CT CERVICAL SPINE WITHOUT CONTRAST TECHNIQUE: Multidetector CT imaging of the cervical spine was performed  without intravenous contrast. Multiplanar CT image reconstructions were also generated. RADIATION DOSE REDUCTION: This exam was performed according to the departmental dose-optimization program which includes automated exposure control, adjustment of the mA and/or kV according to patient size and/or use of iterative reconstruction technique. COMPARISON:  None Available. FINDINGS: Alignment: No traumatic subluxation. Slight broad-based leftward curvature. Skull base and vertebrae: Mild T2 superior endplate compression fracture, age indeterminate. No evidence of acute cervical spine fracture. The dens and skull base are intact. Moderate C1-C2 degenerative change. Soft tissues and spinal canal: No prevertebral fluid or swelling particularly at the site of T2 compression deformity. No visible canal hematoma. Disc levels: Disc space narrowing and anterior spurring C4-C5, C5-C6 and C6-C7. Mild multilevel facet hypertrophy. Upper chest: Emphysema.  No acute findings. Other: Carotid calcifications. IMPRESSION: 1. Mild T2 superior endplate compression fracture, age indeterminate. Recommend correlation with point tenderness. 2. No acute cervical spine fracture. 3. Multilevel degenerative disc disease and facet hypertrophy. Electronically Signed   By: Chadwick Colonel M.D.   On: 07/11/2023 15:33   DG Chest Portable 1 View Result Date: 07/11/2023 CLINICAL DATA:  Syncope EXAM: PORTABLE CHEST 1 VIEW semi upright COMPARISON:  None Available. FINDINGS: Normal cardiopericardial silhouette. Calcified aorta. No pneumothorax, effusion. No consolidation. There is some interstitial changes bilaterally. Overlapping cardiac leads. Eventration of the right hemidiaphragm. Osteopenia. IMPRESSION: Calcified aorta. No consolidation. Likely chronic interstitial changes. Electronically Signed   By: Adrianna Horde M.D.   On: 07/11/2023 13:26    Procedures Procedures  {Document cardiac monitor, telemetry assessment procedure when appropriate:1}  Medications Ordered in ED Medications  iohexol (OMNIPAQUE) 300 MG/ML solution 100 mL (100 mLs Intravenous Contrast Given 07/11/23 1455)  cefTRIAXone (ROCEPHIN) 1 g in sodium chloride 0.9 % 100 mL IVPB (1 g Intravenous New Bag/Given 07/11/23 1614)    ED Course/ Medical Decision Making/ A&P Clinical Course as of 07/11/23 1646  Mon Jul 11, 2023  1507 Urinalysis does suggest possible UTI with positive nitrite leukocyte esterase many bacteria [JK]  1508 Blood gas, venous (at [facility] and [facility])(!!) Venous blood gas without signs of acidosis [JK]  1508 CBC(!) Normal white blood cell count no anemia [JK]  1508 Chest x-ray without acute findings [JK]  1608 CT scan does not show any acute traumatic injury.  Patient has findings suggestive of cystitis.  Patient also has possible constipation.  Incidental aortic aneurysm noted [JK]  1645 Reviewed ct findings.  Inadvertently did not order head ct initially.  Will add on now. [JK]    Clinical Course User Index [JK] Trish Furl, MD   {   Click here for ABCD2, HEART and other calculatorsREFRESH Note before signing :1}                              Medical Decision Making Amount and/or Complexity of Data Reviewed Labs: ordered. Decision-making details documented in ED Course. Radiology:  ordered.  Risk Prescription drug management. Decision regarding hospitalization.   ***  {Document critical care time when appropriate:1} {Document review of labs and clinical decision tools ie heart score, Chads2Vasc2 etc:1}  {Document your independent review of radiology images, and any outside records:1} {Document your discussion with family members, caretakers, and with consultants:1} {Document social determinants of health affecting pt's care:1} {Document your decision making why or why not admission, treatments were needed:1} Final Clinical Impression(s) / ED Diagnoses Final diagnoses:  Acute cystitis without hematuria  Weakness    Rx / DC Orders  ED Discharge Orders     None

## 2023-07-11 NOTE — H&P (Signed)
 History and Physical    Gabrielle Gonzales ZOX:[id number] DOB: [birth year] DOA: 07/11/2023  DOS: the patient was seen and examined on 07/11/2023  PCP: [facility], [facility]   Patient coming from: Home  I have personally briefly reviewed patient's old medical records in [facility]  No notes on file   HPI: 83 year old female W/PMH of HTN, HLD, history of fall with history of pubic fracture in the past presented to [facility] emergency room for a fall yesterday.  Patient was supposed to use a walker but yesterday she did not have 1 in her room.  She had an unwitnessed fall.  Family heard the sound of fall and went to get her up.  Yesterday she did not want to be seen.  This morning they noted that she was complaining of pain in her back and she was also more lethargic.  They also noted some bad odor in the urine.  The son was not clear whether she hit her head or not.  She denies any fever chills nausea vomiting, chest pain shortness of breath palpitations.  She was only complaining of pain in the back around her tailbone.  She denied any dysuria, hematuria or burning micturition. In the ED, she was found positive urine analysis for UTI, chest x-ray without any significant findings, CT scan did not show any traumatic injury but there was a suggestion for possible cystitis.  There was superior endplate of T2 vertebral fracture with undetermined age.  Hospitalist service was consulted for evaluation for admission for possible treatment of urinary tract infection and evaluation for fall. Patient was seen and examined at bedside in the emergency room.  Patient is alert awake pleasant but forgetful.  [name] was at bedside who helped with the history.  Patient had a fall yesterday and did not want to come to the emergency room but this morning she was slightly confused and she was not able to walk around and she was brought in for evaluation.  Review of Systems:  ROS  All other  systems negative except as noted in the HPI.  No past medical history on file.  No past surgical history on file.   reports that she has been smoking cigarettes. She uses smokeless tobacco. She reports current alcohol use. She reports that she does not use drugs.  No Known Allergies  No family history on file.  Prior to Admission medications   Medication Sig Start Date End Date Taking? Authorizing Provider  Calcium Carb-Cholecalciferol [phone] MG-UNIT CAPS Take by mouth.    [provider]  Enalapril -hydroCHLOROthiazide  5-12.5 MG tablet Take 1 tablet by mouth daily. 01/06/18 04/06/18  Sagardia, Miguel Jose, MD  fluticasone (FLONASE) 50 MCG/ACT nasal spray Place into the nose. 04/29/15 04/28/16  [provider]  HYDROcodone -acetaminophen  (NORCO) 5-325 MG tablet Take 1 tablet by mouth every 6 (six) hours as needed. 02/13/21   Jerilynn Montenegro, MD  lisinopril -hydrochlorothiazide  (PRINZIDE ,ZESTORETIC ) 10-12.5 MG tablet Take 1 tablet by mouth daily. 01/17/18   Elvira Hammersmith, MD  PARoxetine  (PAXIL ) 40 MG tablet Take 1 tablet (40 mg total) by mouth every morning. 01/06/18 04/06/18  Elvira Hammersmith, MD  permethrin  (ELIMITE ) 5 % cream Apply to affected area once 04/18/14   Mare Shallow, NP  potassium chloride  SA (KLOR-CON  M) 20 MEQ tablet Take 1 tablet (20 mEq total) by mouth 2 (two) times daily for 5 days. 02/13/21 02/18/21  Jerilynn Montenegro, MD  raloxifene (EVISTA) 60 MG tablet TAKE ONE TABLET BY  MOUTH ONCE DAILY 04/24/15   [provider]  simvastatin  (ZOCOR ) 40 MG tablet Take 1 tablet (40 mg total) by mouth daily at 6 PM. 01/06/18 01/06/19  Sagardia, Isidro Margo, MD  triamcinolone cream (KENALOG) 0.1 % APPLY TOPICALLY TWICE DAILY 09/01/15   [provider]    Physical Exam: Vitals:   07/11/23 1237 07/11/23 1238 07/11/23 1239 07/11/23 1629  BP:    (!) 145/108  Pulse: 80 79  86  Resp:   18 18  Temp:   98.2 F (36.8 C) 98.7 F (37.1 C)  TempSrc:    Oral Oral  SpO2: 98% 96%  94%    Physical Exam  Constitutional: Alert, awake, calm, comfortable HEENT: Neck supple Respiratory: clear to auscultation bilaterally, no wheezing, no crackles. Normal respiratory effort. No accessory muscle use.  Cardiovascular: Regular rate and rhythm, no murmurs / rubs / gallops. No extremity edema. 2+ pedal pulses. No carotid bruits.  Abdomen: no tenderness, no masses palpated. No hepatosplenomegaly. Bowel sounds positive.  Musculoskeletal: no clubbing / cyanosis. No joint deformity upper and lower extremities. Good ROM, no contractures. Normal muscle tone.  Skin: no rashes, lesions, ulcers.  Perineal area has some redness Neurologic: Alert awake, confused, moving all extremities equally, no focal neurological deficit identified Psychiatric: Normal mood  Labs on Admission: I have personally reviewed following labs and imaging studies  CBC: Recent Labs  Lab 07/11/23 1345 07/11/23 1352  WBC 7.4  --   HGB 16.5* 17.0*  HCT 50.3* 50.0*  MCV 91.8  --   PLT 245  --    Basic Metabolic Panel: Recent Labs  Lab 07/11/23 1345 07/11/23 1352  NA 139 140  K 3.1* 3.6  CL 103 102  CO2 24  --   GLUCOSE 86 84  BUN 13 13  CREATININE 0.60 0.70  CALCIUM 8.7*  --    GFR: CrCl cannot be calculated (Unknown ideal weight.). Liver Function Tests: Recent Labs  Lab 07/11/23 1345  AST 13*  ALT 9  ALKPHOS 81  BILITOT 1.5*  PROT 7.6  ALBUMIN 3.7   Urine analysis:    Component Value Date/Time   COLORURINE YELLOW 07/11/2023 1421   APPEARANCEUR HAZY (A) 07/11/2023 1421   LABSPEC 1.012 07/11/2023 1421   PHURINE 5.0 07/11/2023 1421   GLUCOSEU NEGATIVE 07/11/2023 1421   HGBUR SMALL (A) 07/11/2023 1421   BILIRUBINUR NEGATIVE 07/11/2023 1421   KETONESUR 20 (A) 07/11/2023 1421   PROTEINUR NEGATIVE 07/11/2023 1421   NITRITE POSITIVE (A) 07/11/2023 1421   LEUKOCYTESUR TRACE (A) 07/11/2023 1421    Radiological Exams on Admission: I have personally reviewed  images CT ABDOMEN PELVIS W CONTRAST Result Date: 07/11/2023 CLINICAL DATA:  Unwitnessed fall today.  Lower abdominal pain. EXAM: CT ABDOMEN AND PELVIS WITH CONTRAST TECHNIQUE: Multidetector CT imaging of the abdomen and pelvis was performed using the standard protocol following bolus administration of intravenous contrast. RADIATION DOSE REDUCTION: This exam was performed according to the departmental dose-optimization program which includes automated exposure control, adjustment of the mA and/or kV according to patient size and/or use of iterative reconstruction technique. CONTRAST:  100mL OMNIPAQUE IOHEXOL 300 MG/ML  SOLN COMPARISON:  None Available. FINDINGS: Lower chest:  Mild dependent atelectasis in both lung bases. Hepatobiliary: No hepatic laceration or mass identified. No evidence of acute cholecystitis or biliary obstruction. Pancreas: No parenchymal laceration, mass, or inflammatory changes identified. Spleen: No evidence of splenic laceration. Adrenal/Urinary Tract: No hemorrhage or parenchymal lacerations identified. No evidence of suspicious renal masses or hydronephrosis.  Mild diffuse bladder wall thickening again seen, most likely due to cystitis. Stomach/Bowel: Unopacified bowel loops are unremarkable in appearance. No evidence of hemoperitoneum. Large stool burden seen throughout the colon. Diverticulosis is seen mainly involving the sigmoid colon, however there is no evidence of diverticulitis. Vascular/Lymphatic: No evidence of abdominal aortic injury. 4.0 cm saccular infrarenal abdominal aortic aneurysm noted. No evidence of aneurysm leak or rupture. No pathologically enlarged lymph nodes identified. Reproductive:  No mass or other significant abnormality identified. Other:  None. Musculoskeletal: No acute fractures or suspicious bone lesions identified. Old fracture deformity of the left ischial tuberosity again noted. IMPRESSION: No evidence of acute traumatic injury. Mild diffuse bladder  wall thickening, likely due to cystitis. Colonic diverticulosis, without radiographic evidence of diverticulitis. Large stool burden noted; recommend clinical correlation for possible constipation. 4.0 cm saccular infrarenal abdominal aortic aneurysm. Recommend follow-up CT or MR as appropriate in 12 months and referral to or continued care with vascular specialist. (Ref.: J Vasc Surg. 2018; 67:2-77 and J Am Coll Radiol 2013;10(10):789-794.) Electronically Signed   By: Marlyce Sine M.D.   On: 07/11/2023 15:43   CT L-SPINE NO CHARGE Result Date: 07/11/2023 CLINICAL DATA:  Unwitnessed fall. EXAM: CT LUMBAR SPINE WITHOUT CONTRAST TECHNIQUE: Multidetector CT imaging of the lumbar spine was performed without intravenous contrast administration. Multiplanar CT image reconstructions were also generated. RADIATION DOSE REDUCTION: This exam was performed according to the departmental dose-optimization program which includes automated exposure control, adjustment of the mA and/or kV according to patient size and/or use of iterative reconstruction technique. COMPARISON:  Included portion from pelvic CT 02/13/2021 FINDINGS: Segmentation: 5 lumbar type vertebrae. Alignment: Minor anterolisthesis of L4 on L5. Vertebrae: L3 inferior endplate Schmorl's node, new from 2022. No significant loss of height. Posterior elements are intact. Paraspinal and other soft tissues: Concurrent abdominopelvic CT for complete assessment. There is no paraspinal hematoma. Disc levels: Mild broad-based disc bulge at L2-L3. Moderate broad-based disc bulge at L3-L4 and L4-L5 with ligamentum flavum hypertrophy creates central canal stenosis. Moderate L5-S1 facet hypertrophy. IMPRESSION: 1. L3 inferior endplate Schmorl's node, new from 2022 but age indeterminate. No significant loss of height. 2. Multilevel degenerative disc disease and facet hypertrophy. Electronically Signed   By: Chadwick Colonel M.D.   On: 07/11/2023 15:39   CT Cervical Spine Wo  Contrast Result Date: 07/11/2023 CLINICAL DATA:  Neck trauma, unwitnessed fall. EXAM: CT CERVICAL SPINE WITHOUT CONTRAST TECHNIQUE: Multidetector CT imaging of the cervical spine was performed without intravenous contrast. Multiplanar CT image reconstructions were also generated. RADIATION DOSE REDUCTION: This exam was performed according to the departmental dose-optimization program which includes automated exposure control, adjustment of the mA and/or kV according to patient size and/or use of iterative reconstruction technique. COMPARISON:  None Available. FINDINGS: Alignment: No traumatic subluxation. Slight broad-based leftward curvature. Skull base and vertebrae: Mild T2 superior endplate compression fracture, age indeterminate. No evidence of acute cervical spine fracture. The dens and skull base are intact. Moderate C1-C2 degenerative change. Soft tissues and spinal canal: No prevertebral fluid or swelling particularly at the site of T2 compression deformity. No visible canal hematoma. Disc levels: Disc space narrowing and anterior spurring C4-C5, C5-C6 and C6-C7. Mild multilevel facet hypertrophy. Upper chest: Emphysema.  No acute findings. Other: Carotid calcifications. IMPRESSION: 1. Mild T2 superior endplate compression fracture, age indeterminate. Recommend correlation with point tenderness. 2. No acute cervical spine fracture. 3. Multilevel degenerative disc disease and facet hypertrophy. Electronically Signed   By: Chadwick Colonel M.D.   On: 07/11/2023  15:33   DG Chest Portable 1 View Result Date: 07/11/2023 CLINICAL DATA:  Syncope EXAM: PORTABLE CHEST 1 VIEW semi upright COMPARISON:  None Available. FINDINGS: Normal cardiopericardial silhouette. Calcified aorta. No pneumothorax, effusion. No consolidation. There is some interstitial changes bilaterally. Overlapping cardiac leads. Eventration of the right hemidiaphragm. Osteopenia. IMPRESSION: Calcified aorta. No consolidation. Likely chronic  interstitial changes. Electronically Signed   By: Adrianna Horde M.D.   On: 07/11/2023 13:26    EKG: My personal interpretation of EKG shows: Sinus rhythm, occasional APCs    Assessment/Plan Principal Problem:   UTI (urinary tract infection) Active Problems:   Pressure injury of skin    This is an 83 year old female known/PMH of HTN, HLD and history of previous fall with pubic bone fracture presented to ED today for an unwitnessed fall at home yesterday.  She was found to have urinary tract infection.  1.  UTI - Likely cystitis - She was given antibiotic in the emergency room. - Culture pending - Will continue ceftriaxone for total of 3 to 5 days.  2.  Fall at home/deconditioning - She has T2 endplate fracture of undetermined age. - There is no tender point in the spine - She will be given pain medication - Physical therapy will evaluate her.  3.  HTN - Appears to be stable. - She used to take enalapril  hydrochlorothiazide  combination medication and lisinopril  hydrochlorothiazide  combination. - I will put her on lisinopril  and see how she does.  4.  Hyperlipidemia - Will resume simvastatin  at home dose  5.  Dementia - Supportive care  6.  Hypokalemia - Potassium was replaced in the ED with improvement - Will check potassium level in the morning  7.  Pressure injury around sacral coccygeal and perineal area - Change position frequently - Monitor  8.  Chronic smoking/nicotine dependence - Counseling was done for smoking cessation     DVT prophylaxis: Lovenox Code Status: Full Code Family Communication: [name] Norlin Beck was at bedside Disposition Plan: Home versus rehab Consults called: N/A Admission status: Observation, Med-Surg   Beatris Bough, MD [facility] 07/11/2023, 5:23 PM

## 2023-07-12 ENCOUNTER — Observation Stay (HOSPITAL_COMMUNITY)

## 2023-07-12 DIAGNOSIS — I10 Essential (primary) hypertension: Secondary | ICD-10-CM

## 2023-07-12 DIAGNOSIS — N3000 Acute cystitis without hematuria: Secondary | ICD-10-CM | POA: Diagnosis not present

## 2023-07-12 DIAGNOSIS — R531 Weakness: Secondary | ICD-10-CM | POA: Diagnosis not present

## 2023-07-12 LAB — COMPREHENSIVE METABOLIC PANEL WITH GFR
ALT: 7 U/L (ref 0–44)
AST: 10 U/L — ABNORMAL LOW (ref 15–41)
Albumin: 2.9 g/dL — ABNORMAL LOW (ref 3.5–5.0)
Alkaline Phosphatase: 63 U/L (ref 38–126)
Anion gap: 11 (ref 5–15)
BUN: 17 mg/dL (ref 8–23)
CO2: 20 mmol/L — ABNORMAL LOW (ref 22–32)
Calcium: 8.4 mg/dL — ABNORMAL LOW (ref 8.9–10.3)
Chloride: 105 mmol/L (ref 98–111)
Creatinine, Ser: 0.57 mg/dL (ref 0.44–1.00)
GFR, Estimated: >60 mL/min (ref >60)
Glucose, Bld: 122 mg/dL — ABNORMAL HIGH (ref 70–99)
Potassium: 3.1 mmol/L — ABNORMAL LOW (ref 3.5–5.1)
Sodium: 136 mmol/L (ref 135–145)
Total Bilirubin: 1.5 mg/dL — ABNORMAL HIGH (ref 0.0–1.2)
Total Protein: 5.8 g/dL — ABNORMAL LOW (ref 6.5–8.1)

## 2023-07-12 LAB — CBC
HCT: 42.7 % (ref 36.0–46.0)
Hemoglobin: 13.8 g/dL (ref 12.0–15.0)
MCH: 30.2 pg (ref 26.0–34.0)
MCHC: 32.3 g/dL (ref 30.0–36.0)
MCV: 93.4 fL (ref 80.0–100.0)
Platelets: 211 10*3/uL (ref 150–400)
RBC: 4.57 MIL/uL (ref 3.87–5.11)
RDW: 14.3 % (ref 11.5–15.5)
WBC: 7.3 10*3/uL (ref 4.0–10.5)
nRBC: 0 % (ref 0.0–0.2)

## 2023-07-12 MED ORDER — GERHARDT'S BUTT CREAM
TOPICAL_CREAM | Freq: Two times a day (BID) | CUTANEOUS | Status: DC
Start: 2023-07-12 — End: 2023-07-14
  Administered 2023-07-12: 1 via TOPICAL
  Filled 2023-07-12: qty 60

## 2023-07-12 MED ORDER — LACTATED RINGERS IV SOLN: INTRAVENOUS | Status: AC

## 2023-07-12 MED ORDER — POTASSIUM CHLORIDE CRYS ER 20 MEQ PO TBCR
40.0000 meq | EXTENDED_RELEASE_TABLET | Freq: Once | ORAL | Status: AC
  Administered 2023-07-12: 40 meq via ORAL
  Filled 2023-07-12: qty 2

## 2023-07-12 NOTE — Evaluation (Signed)
 Occupational Therapy Evaluation Patient Details Name: Gabrielle Gonzales MRN: [id number] DOB: 1940/07/13 Today's Date: 07/12/2023   History of Present Illness   Gabrielle Gonzales is an 83 y o female s/p unwitnessed fall at home admitted with dx UTI and T 2 end plate fx of undeterminate age. PMH: dementia, hx of pelvic fx,  hypokalemia, nicotine dep     Clinical Impressions PTA, patient was amb with RW and completing most BADL's and simple sandwich prep with supervision. Son reports patient is unable to access tub shower due to lack of DME and has difficulty assisting with mobility to MD appts/outings. Currently, patient presents with the following deficits (see OT Problem List for details) most significantly close to baseline (as per son) cognitive/visual deficits, generalized muscle weakness, decreased balance and activity tolerance impacting BADL's and mobility. OT will continue to follow while on Acute hospital setting. Recommending tub transfer bench, w/c and cushion, and HHOT services with family support and assistance upon discharge.      If plan is discharge home, recommend the following:   A little help with walking and/or transfers;A little help with bathing/dressing/bathroom;Assistance with cooking/housework;Direct supervision/assist for medications management;Direct supervision/assist for financial management;Assist for transportation;Help with stairs or ramp for entrance;Supervision due to cognitive status     Functional Status Assessment   Patient has had a recent decline in their functional status and demonstrates the ability to make significant improvements in function in a reasonable and predictable amount of time.     Equipment Recommendations   Wheelchair (measurements OT);Wheelchair cushion (measurements OT);Tub/shower bench      Precautions/Restrictions   Precautions Precautions: Fall Restrictions Weight Bearing Restrictions Per Provider Order: No     Mobility  Bed Mobility Overal bed mobility: Needs Assistance Bed Mobility: Supine to Sit     Supine to sit: Min assist, HOB elevated, Used rails     General bed mobility comments: increased time and effort to move hips to EOB    Transfers Overall transfer level: Needs assistance Equipment used: Rolling walker (2 wheels) Transfers: Sit to/from Stand, Bed to chair/wheelchair/BSC Sit to Stand: Min assist     Step pivot transfers: Min assist     General transfer comment: amb with increased time, cues for stwep length, hand placement and RW navi through tighter spaces      Balance Overall balance assessment: Needs assistance Sitting-balance support: Feet supported Sitting balance-Leahy Scale: Fair     Standing balance support: Reliant on assistive device for balance, During functional activity, Bilateral upper extremity supported Standing balance-Leahy Scale: Poor Standing balance comment: improved once steaddied on RW                           ADL either performed or assessed with clinical judgement   ADL Overall ADL's : Needs assistance/impaired Eating/Feeding: Set up;Sitting   Grooming: Wash/dry hands;Wash/dry face;Oral care;Set up;Sitting   Upper Body Bathing: Set up;Cueing for sequencing;Sitting   Lower Body Bathing: Contact guard assist;Sit to/from stand;Cueing for safety;Cueing for sequencing   Upper Body Dressing : Contact guard assist;Sitting   Lower Body Dressing: Contact guard assist;Sit to/from stand;Cueing for sequencing;Cueing for safety   Toilet Transfer: Minimal assistance Toilet Transfer Details (indicate cue type and reason): min cues for hand placement Toileting- Clothing Manipulation and Hygiene: Contact guard assist;Sitting/lateral lean Toileting - Clothing Manipulation Details (indicate cue type and reason): min cues for thoroughness Tub/ Shower Transfer: Minimal assistance;Rolling walker (2 wheels)   Functional mobility during ADLs: Minimal  assistance;Rolling walker (2 wheels) General ADL Comments: educated patient and son on tub transfer and w/c need, cues for sequencing     Vision Baseline Vision/History: 3 Glaucoma Ability to See in Adequate Light: 1 Impaired Patient Visual Report: Blurring of vision;No change from baseline       Perception Perception: Impaired Preception Impairment Details: Spatial orientation     Praxis Praxis: Impaired Praxis Impairment Details: Motor planning     Pertinent Vitals/Pain Pain Assessment Pain Assessment: No/denies pain     Extremity/Trunk Assessment Upper Extremity Assessment Upper Extremity Assessment: Generalized weakness;Right hand dominant   Lower Extremity Assessment Lower Extremity Assessment: Generalized weakness   Cervical / Trunk Assessment Cervical / Trunk Assessment: Other exceptions (mild forward head)   Communication Communication Communication: No apparent difficulties   Cognition Arousal: Alert Behavior During Therapy: WFL for tasks assessed/performed Cognition: History of cognitive impairments             OT - Cognition Comments: STM and safety deficits, sons both aware and provide 24/7 supervision and assist                 Following commands: Impaired       Cueing  General Comments   Cueing Techniques: Verbal cues  soft BP but remained stable           Home Living Family/patient expects to be discharged to:: Private residence Living Arrangements: Children Available Help at Discharge: Available 24 hours/day Type of Home: House Home Access: Stairs to enter [employer] of Steps: 4 Entrance Stairs-Rails: Right;Left;Can reach both Home Layout: One level     Bathroom Shower/Tub: IT trainer: Standard Bathroom Accessibility: Yes How Accessible: Accessible via walker Home Equipment: Hand held shower head;Shower Counsellor (2 wheels);Cane - single point   Additional  Comments: uses counter next to toilet, son open to w/c and TTB      Prior Functioning/Environment Prior Level of Function : Needs assist  Cognitive Assist : Mobility (cognitive);ADLs (cognitive) Mobility (Cognitive): Set up cues ADLs (Cognitive): Set up cues Physical Assist : Mobility (physical);ADLs (physical) Mobility (physical): Stairs ADLs (physical): IADLs        OT Problem List: Decreased strength;Decreased activity tolerance;Impaired balance (sitting and/or standing);Impaired vision/perception;Decreased coordination;Decreased cognition;Decreased safety awareness;Decreased knowledge of use of DME or AE;Decreased knowledge of precautions;Cardiopulmonary status limiting activity   OT Treatment/Interventions: Self-care/ADL training;Therapeutic exercise;Neuromuscular education;Energy conservation;DME and/or AE instruction;Therapeutic activities;Cognitive remediation/compensation;Visual/perceptual remediation/compensation;Patient/family education;Balance training      OT Goals(Current goals can be found in the care plan section)   Acute Rehab OT Goals Patient Stated Goal: to go home OT Goal Formulation: With patient/family Time For Goal Achievement: 07/26/23 Potential to Achieve Goals: Good ADL Goals Pt Will Perform Lower Body Bathing: with contact guard assist;sit to/from stand Pt Will Perform Lower Body Dressing: with contact guard assist;sit to/from stand Pt Will Transfer to Toilet: with supervision;ambulating Pt Will Perform Toileting - Clothing Manipulation and hygiene: sit to/from stand Pt Will Perform Tub/Shower Transfer: with contact guard assist;tub bench;rolling walker Pt/caregiver will Perform Home Exercise Program: Both right and left upper extremity;Increased strength;Independently;With written HEP provided   OT Frequency:  Min 2X/week    Co-evaluation PT/OT/SLP Co-Evaluation/Treatment: Yes Reason for Co-Treatment: To address functional/ADL transfers PT goals  addressed during session: Mobility/safety with mobility;Balance        AM-PAC OT "6 Clicks" Daily Activity     Outcome Measure Help from another person eating meals?: A Little Help from another person taking care of personal grooming?:  A Little Help from another person toileting, which includes using toliet, bedpan, or urinal?: A Little Help from another person bathing (including washing, rinsing, drying)?: A Little Help from another person to put on and taking off regular upper body clothing?: A Little Help from another person to put on and taking off regular lower body clothing?: A Little 6 Click Score: 18   End of Session Equipment Utilized During Treatment: Gait belt;Rolling walker (2 wheels) Nurse Communication: Mobility status  Activity Tolerance: Patient tolerated treatment well Patient left: with call bell/phone within reach;with chair alarm set  OT Visit Diagnosis: Unsteadiness on feet (R26.81);Muscle weakness (generalized) (M62.81);History of falling (Z91.81);Other symptoms and signs involving cognitive function                Time: 6644-0347 OT Time Calculation (min): 35 min Charges:  OT General Charges $OT Visit: 1 Visit OT Evaluation $OT Eval Moderate Complexity: 1 Mod OT Treatments $Self Care/Home Management : 8-22 mins Dhaval Woo OT/L Acute Rehabilitation Department  [phone]  07/12/2023, 11:16 AM

## 2023-07-12 NOTE — Evaluation (Signed)
 Physical Therapy Evaluation Patient Details Name: Gabrielle Gonzales MRN: [id number] DOB: Aug 09, 1940 Today's Date: 07/12/2023  History of Present Illness  Gabrielle Gonzales is an 83 y o female s/p unwitnessed fall at home admitted with dx UTI and T 2 end plate fx of undeterminate age. PMH: dementia, hx of pelvic fx,  hypokalemia, nicotine dep  Clinical Impression  Pt admitted with above diagnosis.  Pt currently with functional limitations due to the deficits listed below (see PT Problem List). Pt will benefit from acute skilled PT to increase their independence and safety with mobility to allow discharge.     The patient's son present for  providing  information. Patient is pleasant and quiet. Patient did ambulate using the RW 20' x 2. Patient moving slowly. Encouraged son that patient needs someone to assist her and keep her safe in the home until she gets back to her baseline.  Son requesting a WC to assist with transportation out of home to MD offices.  Recommend HHPT .        If plan is discharge home, recommend the following: A little help with walking and/or transfers;A little help with bathing/dressing/bathroom;Assistance with cooking/housework;Help with stairs or ramp for entrance;Direct supervision/assist for medications management;Assist for transportation   Can travel by private vehicle        Equipment Recommendations Wheelchair (measurements PT);Wheelchair cushion (measurements PT)  Recommendations for Other Services       Functional Status Assessment Patient has had a recent decline in their functional status and demonstrates the ability to make significant improvements in function in a reasonable and predictable amount of time.     Precautions / Restrictions Precautions Precautions: Fall Restrictions Weight Bearing Restrictions Per Provider Order: No      Mobility  Bed Mobility Overal bed mobility: Needs Assistance Bed Mobility: Supine to Sit     Supine to sit: Min  assist, HOB elevated, Used rails     General bed mobility comments: increased time and effort to move hips to EOB    Transfers Overall transfer level: Needs assistance Equipment used: Rolling walker (2 wheels) Transfers: Sit to/from Stand Sit to Stand: Min assist           General transfer comment: slow to respond to stand    Ambulation/Gait Ambulation/Gait assistance: Min assist Gait Distance (Feet): 20 Feet (x 2) Assistive device: Rolling walker (2 wheels) Gait Pattern/deviations: Trunk flexed, Step-to pattern Gait velocity: decr     General Gait Details: slow cadence, assist  with forward progression, tends to have Rw too for forward.  Stairs            Wheelchair Mobility     Tilt Bed    Modified Rankin (Stroke Patients Only)       Balance Overall balance assessment: Needs assistance, History of Falls   Sitting balance-Leahy Scale: Fair     Standing balance support: Reliant on assistive device for balance, During functional activity, Bilateral upper extremity supported Standing balance-Leahy Scale: Poor                               Pertinent Vitals/Pain Pain Assessment Pain Assessment: No/denies pain    Home Living Family/patient expects to be discharged to:: Private residence Living Arrangements: Children Available Help at Discharge: Available 24 hours/day Type of Home: House Home Access: Stairs to enter Entrance Stairs-Rails: Right;Left;Can reach both Entrance Stairs-Number of Steps: 4   Home Layout: One level Home Equipment:  Hand held shower head;Shower Counsellor (2 wheels);Cane - single point Additional Comments: uses counter next to toilet, son open to w/c and TTB    Prior Function Prior Level of Function : Needs assist  Cognitive Assist : Mobility (cognitive);ADLs (cognitive) Mobility (Cognitive): Set up cues ADLs (Cognitive): Set up cues Physical Assist : Mobility (physical);ADLs (physical) Mobility  (physical): Stairs ADLs (physical): IADLs Mobility Comments: uses RW, at times forgets it ADLs Comments: independnet, sons not comfortable with some aspects     Extremity/Trunk Assessment   Upper Extremity Assessment Upper Extremity Assessment: Generalized weakness    Lower Extremity Assessment Lower Extremity Assessment: Generalized weakness    Cervical / Trunk Assessment Cervical / Trunk Assessment: Other exceptions Cervical / Trunk Exceptions: head ford mild  Communication   Communication Communication: No apparent difficulties    Cognition Arousal: Alert Behavior During Therapy: Flat affect, WFL for tasks assessed/performed   PT - Cognitive impairments: Orientation   Orientation impairments: Time, Place, Situation                   PT - Cognition Comments: son reports that patient has wandered from the home Following commands: Impaired Following commands impaired: Follows one step commands inconsistently     Cueing Cueing Techniques: Verbal cues     General Comments General comments (skin integrity, edema, etc.): soft BP but remained stable    Exercises     Assessment/Plan    PT Assessment Patient needs continued PT services  PT Problem List Decreased strength;Decreased activity tolerance;Decreased mobility;Decreased cognition;Decreased safety awareness;Decreased knowledge of precautions       PT Treatment Interventions DME instruction;Therapeutic activities;Cognitive remediation;Gait training;Functional mobility training;Therapeutic exercise;Patient/family education    PT Goals (Current goals can be found in the Care Plan section)  Acute Rehab PT Goals Patient Stated Goal: agreed to amb, per son, return home PT Goal Formulation: With patient/family Time For Goal Achievement: 07/26/23 Potential to Achieve Goals: Fair    Frequency Min 3X/week     Co-evaluation PT/OT/SLP Co-Evaluation/Treatment: Yes Reason for Co-Treatment: To address  functional/ADL transfers PT goals addressed during session: Mobility/safety with mobility;Balance         AM-PAC PT "6 Clicks" Mobility  Outcome Measure Help needed turning from your back to your side while in a flat bed without using bedrails?: A Little Help needed moving from lying on your back to sitting on the side of a flat bed without using bedrails?: A Little Help needed moving to and from a bed to a chair (including a wheelchair)?: A Little Help needed standing up from a chair using your arms (e.g., wheelchair or bedside chair)?: A Lot Help needed to walk in hospital room?: A Lot Help needed climbing 3-5 steps with a railing? : A Lot 6 Click Score: 15    End of Session Equipment Utilized During Treatment: Gait belt Activity Tolerance: Patient tolerated treatment well Patient left: in chair;with call bell/phone within reach;with chair alarm set;with family/visitor present Nurse Communication: Mobility status PT Visit Diagnosis: Unsteadiness on feet (R26.81)    Time: 1610-9604 PT Time Calculation (min) (ACUTE ONLY): 27 min   Charges:   PT Evaluation $PT Eval Low Complexity: 1 Low   PT General Charges $$ ACUTE PT VISIT: 1 Visit         Abelina Hoes PT Acute Rehabilitation Services Office [phone]   Dareen Ebbing 07/12/2023, 1:02 PM

## 2023-07-12 NOTE — Consult Note (Addendum)
 WOC Nurse Consult Note: Reason for Consult:sacral and labial wounds  Wound type:  1. Moisture Associated Skin Damage to groin/buttocks  ICD-10 CM Codes for Irritant Dermatitis L24A2 - Due to fecal, urinary or dual incontinence  L30.4  - Erythema intertrigo. Also used for abrasion of the hand, chafing of the skin, dermatitis due to sweating and friction, friction dermatitis, friction eczema, and genital/thigh intertrigo.  2. Ischium/lower buttocks appears to have been a deep tissue pressure injury that has evolved to Stage 2  Pressure Injury red moist  3.  Sacrum/buttocks with Deep Tissue Pressure Injury purple maroon discoloration  Pressure Injury POA: Yes Measurement:see nursing flowsheet  Wound bed:  as above  Drainage (amount, consistency, odor) see nursing flowsheet  Periwound: widespread erythema likely related to moisture Dressing procedure/placement/frequency:  Cleanse sacrum/buttocks/ischium (intact skin and wounds) with Vashe wound cleanser Timm Foot [phone]) do not rinse and allow to air dry. Apply Xeroform gauze ([place]son [phone]) to purple maroon discoloration sacrum and Stage 2 of lower buttock, secure with silicone foam.   Cleanse groin/inner thighs/perineal area with Vashe, apply Gerhardt's Butt Cream 2 times a day and prn soiling.   POC discussed with bedside nurse. WOC team will not follow. Re-consult if further needs arise.   Thank you,    Ronni Colace MSN, RN-BC, [employer] [phone]

## 2023-07-12 NOTE — Care Management Obs Status (Signed)
 MEDICARE OBSERVATION STATUS NOTIFICATION   Patient Details  Name: Gabrielle Gonzales MRN: [id number] Date of Birth: 08-Dec-1940   Medicare Observation Status Notification Given:  Yes    [name], RN 07/12/2023, 11:25 AM

## 2023-07-12 NOTE — TOC Initial Note (Signed)
 Transition of Care [facility]) - Initial/Assessment Note    Patient Details  Name: Gabrielle Gonzales MRN: [id number] Date of Birth: [birth year]  Transition of [facility] CM/SW Contact:    [name], RN Phone Number: 07/12/2023, 11:59 AM  Clinical Narrative:                 NCM spoke with patient and son Vance Gell [phone]) in room.  PTA patient lives in mobile home with 2 sons; PCP/insurance verified. Encouraged patient to make a hospital follow-up discharge appointment with PCP; DME-cane and walker; No HH or SDOH needs. Patients son Marjo Sievert will transport home at discharge. TOC will follow progression to discharge  Expected Discharge Plan: Home/Self Care Barriers to Discharge: Continued Medical Work up   Patient Goals and CMS Choice     Choice offered to / list presented to : NA [place] ownership interest in [facility].provided to:: Parent NA    Expected Discharge Plan and Services   Discharge Planning Services: CM Consult Post Acute Care Choice: NA Living arrangements for the past 2 months: Mobile Home                 DME Arranged: N/A DME Agency: NA       HH Arranged: NA HH Agency: NA        Prior Living Arrangements/Services Living arrangements for the past 2 months: Mobile Home Lives with:: Adult Children Patient language and need for interpreter reviewed:: Yes Do you feel safe going back to the place where you live?: Yes      Need for Family Participation in Patient Care: No (Comment) Care giver support system in place?: Yes (comment) Current home services: DME (cane, walker) Criminal Activity/Legal Involvement Pertinent to Current Situation/Hospitalization: No - Comment as needed  Activities of Daily Living   ADL Screening (condition at time of admission) Independently performs ADLs?: No Does the patient have a NEW difficulty with bathing/dressing/toileting/self-feeding that is expected to last >3 days?: No Does the patient have a NEW  difficulty with getting in/out of bed, walking, or climbing stairs that is expected to last >3 days?: No Does the patient have a NEW difficulty with communication that is expected to last >3 days?: No Is the patient deaf or have difficulty hearing?: No Does the patient have difficulty seeing, even when wearing glasses/contacts?: Yes Does the patient have difficulty concentrating, remembering, or making decisions?: Yes  Permission Sought/Granted Permission sought to share information with : Case Manager Permission granted to share information with : Yes, Verbal Permission Granted  Share Information with NAME: Marjo Sievert     Permission granted to share info w Relationship: son  Permission granted to share info w Contact Information: [phone]  Emotional Assessment Appearance:: Appears stated age Attitude/Demeanor/Rapport: Engaged Affect (typically observed): Appropriate Orientation: : Oriented to Self, Oriented to Place, Oriented to  Time, Oriented to Situation Alcohol / Substance Use: Not Applicable Psych Involvement: No (comment)  Admission diagnosis:  UTI (urinary tract infection) [N39.0] Weakness [R53.1] Acute cystitis without hematuria [N30.00] Patient Active Problem List   Diagnosis Date Noted   UTI (urinary tract infection) 07/11/2023   Pressure injury of skin 07/11/2023   Knee osteoarthritis 12/30/2015   PCP:  [facility] [employer] Pharmacy:   [id number] - [place], [place] - [address] [place]  [address] Phone: [phone] Fax: [phone]     Social Drivers of Health (SDOH) Social History: SDOH Screenings   Food Insecurity: Patient Unable To Answer (07/11/2023)  Housing: Patient Unable To Answer (07/11/2023)  Transportation Needs: Patient Unable To Answer (07/11/2023)  Utilities: Patient Unable To Answer (07/11/2023)  Depression (PHQ2-9): Low Risk  (12/12/2018)  Tobacco Use: High Risk (07/11/2023)   SDOH Interventions:      Readmission Risk Interventions     No data to display

## 2023-07-12 NOTE — Progress Notes (Addendum)
 [facility]                                                                              Gabrielle Gonzales, is a 83 y.o. female, DOB - November 13, 1940, WUJ:[id number] Admit date - 07/11/2023    Outpatient Primary MD for the patient is [facility] [employer]  LOS - 0  days  Chief Complaint  Patient presents with   Fall       Brief summary   Patient is a 83 year old female with HTN, hyperlipidemia, history of fall with pubic fracture in the past presented to ED with a fall a day before the admission.  Patient was supposed to use a walker but she did not have the walker in the room.  She had an unwitnessed fall.  Family heard the fall and got her up, she did not want to be seen in the ED. on the morning of admission next day, they noted that she was complaining of pain in her back and also more lethargic, foul-smelling urine.  Son did not know if she hit her head. In ED, UA was positive for UTI, chest x-ray negative for pneumonia.  CT did not show any traumatic injury, superior endplate T2 vertebral fracture of undetermined age.  Assessment & Plan       UTI (urinary tract infection) -Follow urine culture and sensitivities - Continue IV Rocephin   Fall at home, deconditioning - T2 endplate fracture of undetermined age, no point tenderness - PT OT evaluation today - Continue pain control as needed  Subdural hematoma likely from the fall - CT head reviewed, showed subacute bilateral parafalcine subdural hematoma measuring up to 4 mm on the left and 3 mm on the right.  - Continue monitoring, will repeat CT head again today.  If worsening, will consult neurosurgery.  DC'ed prophylactic Lovenox.  Essential hypertension - BP was elevated in the ED, was placed on lisinopril  10 mg daily - now improved, continue to monitor  Dementia - Continue supportive care  Hyperlipidemia - Continue simvastatin   Hypokalemia - Replaced    Pressure injury of skin, POA Pressure Injury  07/11/23 Buttocks Left Stage 2 -  Partial thickness loss of dermis presenting as a shallow open injury with a red, pink wound bed without slough. (Active)  07/11/23 1901  Location: Buttocks  Location Orientation: Left  Staging: Stage 2 -  Partial thickness loss of dermis presenting as a shallow open injury with a red, pink wound bed without slough.  Wound Description (Comments):   Present on Admission: Yes  Dressing Type Foam - Lift dressing to assess site every shift 07/11/23 1947     Estimated body mass index is 21.19 kg/m as calculated from the following:   Height as of this encounter: 5\' 4"  (1.626 m).   Weight as of this encounter: 56 kg.  Code Status: Full CODE STATUS DVT Prophylaxis:  Place and maintain sequential compression device Start: 07/12/23 1114 SCDs Start: 07/11/23 1701   Level of Care: Level of care: Med-Surg Family Communication: No family at bedside Disposition Plan:      Remains inpatient appropriate:     Procedures:  Consultants:     Antimicrobials:   Anti-infectives (From admission, onward)    Start     Dose/Rate Route Frequency Ordered Stop   07/12/23 1600  cefTRIAXone (ROCEPHIN) 1 g in sodium chloride 0.9 % 100 mL IVPB        1 g 200 mL/hr over 30 Minutes Intravenous Every 24 hours 07/11/23 1704     07/11/23 1515  cefTRIAXone (ROCEPHIN) 1 g in sodium chloride 0.9 % 100 mL IVPB        1 g 200 mL/hr over 30 Minutes Intravenous  Once 07/11/23 1509 07/11/23 1644          Medications  Gerhardt's butt cream   Topical BID   lisinopril   10 mg Oral Daily   potassium chloride   40 mEq Oral Once   simvastatin   40 mg Oral QHS      Subjective:   Gabrielle Gonzales was seen and examined today.  Denies any specific complaints, feeling better today, no back pain.  No fever chills, nausea vomiting or abdominal pain.    Objective:   Vitals:   07/11/23 1947 07/12/23 0128 07/12/23 0554 07/12/23 0559  BP: (!) 115/90 112/70 90/69 97/74   Pulse: 85 81 73  75  Resp: 20 18 18 16   Temp: 98.3 F (36.8 C) 98.3 F (36.8 C) (!) 97.3 F (36.3 C)   TempSrc: Oral Oral Oral   SpO2: 93% 93% 94% 97%  Weight:      Height:        Intake/Output Summary (Last 24 hours) at 07/12/2023 1113 Last data filed at 07/12/2023 0900 Gross per 24 hour  Intake 240 ml  Output 300 ml  Net -60 ml     Wt Readings from Last 3 Encounters:  07/11/23 56 kg  02/13/21 81.6 kg  01/06/18 84.7 kg     Exam General: Alert and oriented, NAD, pleasant Cardiovascular: S1 S2 auscultated,  RRR Respiratory: Clear to auscultation bilaterally Gastrointestinal: Soft, nontender, nondistended, + bowel sounds Ext: no pedal edema bilaterally Neuro: no new deficits Psych: Normal affect     Data Reviewed:  I have personally reviewed following labs    CBC Lab Results  Component Value Date   WBC 7.3 07/12/2023   RBC 4.57 07/12/2023   HGB 13.8 07/12/2023   HCT 42.7 07/12/2023   MCV 93.4 07/12/2023   MCH 30.2 07/12/2023   PLT 211 07/12/2023   MCHC 32.3 07/12/2023   RDW 14.3 07/12/2023     Last metabolic panel Lab Results  Component Value Date   NA 136 07/12/2023   K 3.1 (L) 07/12/2023   CL 105 07/12/2023   CO2 20 (L) 07/12/2023   BUN 17 07/12/2023   CREATININE 0.57 07/12/2023   GLUCOSE 122 (H) 07/12/2023   GFRNONAA >60 07/12/2023   CALCIUM 8.4 (L) 07/12/2023   PROT 5.8 (L) 07/12/2023   ALBUMIN 2.9 (L) 07/12/2023   BILITOT 1.5 (H) 07/12/2023   ALKPHOS 63 07/12/2023   AST 10 (L) 07/12/2023   ALT 7 07/12/2023   ANIONGAP 11 07/12/2023    CBG (last 3)  No results for input(s): "GLUCAP" in the last 72 hours.    Coagulation Profile: No results for input(s): "INR", "PROTIME" in the last 168 hours.   Radiology Studies: I have personally reviewed the imaging studies  CT Head Wo Contrast Result Date: 07/11/2023 CLINICAL DATA:  Head trauma, minor (Age >= 65y) fall EXAM: CT HEAD WITHOUT CONTRAST TECHNIQUE: Contiguous axial images were obtained from the base  of  the skull through the vertex without intravenous contrast. RADIATION DOSE REDUCTION: This exam was performed according to the departmental dose-optimization program which includes automated exposure control, adjustment of the mA and/or kV according to patient size and/or use of iterative reconstruction technique. COMPARISON:  None Available. FINDINGS: Brain: Cerebral ventricle sizes are concordant with the degree of cerebral volume loss. Patchy and confluent areas of decreased attenuation are noted throughout the deep and periventricular white matter of the cerebral hemispheres bilaterally, compatible with chronic microvascular ischemic disease. no evidence of large-territorial acute infarction. No parenchymal hemorrhage. No mass lesion. No extra-axial collection. Hypodense thickening of the falx cerebri bilaterally measuring up to 4 mm on the left and 3 mm on the right (5:9, 32). 2.2 cm fluid density right calvarial convexity lentiform lesion along the frontotemporal lobes with associated mass effect. No mass effect or midline shift. No hydrocephalus. Basilar cisterns are patent. Vascular: No hyperdense vessel. Atherosclerotic calcifications are present within the cavernous internal carotid arteries. Skull: No acute fracture or focal lesion. Sinuses/Orbits: Paranasal sinuses and mastoid air cells are clear. The orbits are unremarkable. Other: A 3 x 1 cm peripherally calcified left parieto-occipital scalp soft tissue density lesion with surrounding edema. IMPRESSION: 1. No acute intracranial abnormality. 2. Subacute bilateral parafalcine subdural hematoma measuring up to 4 mm on the left and 3 mm on the right. 3. 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 4. A 3 x 1 cm peripherally calcified left parieto-occipital scalp soft tissue density lesion with surrounding edema. Correlate with physical exam Electronically Signed   By: Morgane  Naveau M.D.   On: 07/11/2023 17:46   CT ABDOMEN PELVIS W CONTRAST Result  Date: 07/11/2023 CLINICAL DATA:  Unwitnessed fall today.  Lower abdominal pain. EXAM: CT ABDOMEN AND PELVIS WITH CONTRAST TECHNIQUE: Multidetector CT imaging of the abdomen and pelvis was performed using the standard protocol following bolus administration of intravenous contrast. RADIATION DOSE REDUCTION: This exam was performed according to the departmental dose-optimization program which includes automated exposure control, adjustment of the mA and/or kV according to patient size and/or use of iterative reconstruction technique. CONTRAST:  100mL OMNIPAQUE IOHEXOL 300 MG/ML  SOLN COMPARISON:  None Available. FINDINGS: Lower chest:  Mild dependent atelectasis in both lung bases. Hepatobiliary: No hepatic laceration or mass identified. No evidence of acute cholecystitis or biliary obstruction. Pancreas: No parenchymal laceration, mass, or inflammatory changes identified. Spleen: No evidence of splenic laceration. Adrenal/Urinary Tract: No hemorrhage or parenchymal lacerations identified. No evidence of suspicious renal masses or hydronephrosis. Mild diffuse bladder wall thickening again seen, most likely due to cystitis. Stomach/Bowel: Unopacified bowel loops are unremarkable in appearance. No evidence of hemoperitoneum. Large stool burden seen throughout the colon. Diverticulosis is seen mainly involving the sigmoid colon, however there is no evidence of diverticulitis. Vascular/Lymphatic: No evidence of abdominal aortic injury. 4.0 cm saccular infrarenal abdominal aortic aneurysm noted. No evidence of aneurysm leak or rupture. No pathologically enlarged lymph nodes identified. Reproductive:  No mass or other significant abnormality identified. Other:  None. Musculoskeletal: No acute fractures or suspicious bone lesions identified. Old fracture deformity of the left ischial tuberosity again noted. IMPRESSION: No evidence of acute traumatic injury. Mild diffuse bladder wall thickening, likely due to cystitis.  Colonic diverticulosis, without radiographic evidence of diverticulitis. Large stool burden noted; recommend clinical correlation for possible constipation. 4.0 cm saccular infrarenal abdominal aortic aneurysm. Recommend follow-up CT or MR as appropriate in 12 months and referral to or continued care with vascular specialist. (Ref.: J Vasc Surg. 2018; 67:2-77  and J Am Coll Radiol 2013;10(10):789-794.) Electronically Signed   By: Marlyce Sine M.D.   On: 07/11/2023 15:43   CT L-SPINE NO CHARGE Result Date: 07/11/2023 CLINICAL DATA:  Unwitnessed fall. EXAM: CT LUMBAR SPINE WITHOUT CONTRAST TECHNIQUE: Multidetector CT imaging of the lumbar spine was performed without intravenous contrast administration. Multiplanar CT image reconstructions were also generated. RADIATION DOSE REDUCTION: This exam was performed according to the departmental dose-optimization program which includes automated exposure control, adjustment of the mA and/or kV according to patient size and/or use of iterative reconstruction technique. COMPARISON:  Included portion from pelvic CT 02/13/2021 FINDINGS: Segmentation: 5 lumbar type vertebrae. Alignment: Minor anterolisthesis of L4 on L5. Vertebrae: L3 inferior endplate Schmorl's node, new from 2022. No significant loss of height. Posterior elements are intact. Paraspinal and other soft tissues: Concurrent abdominopelvic CT for complete assessment. There is no paraspinal hematoma. Disc levels: Mild broad-based disc bulge at L2-L3. Moderate broad-based disc bulge at L3-L4 and L4-L5 with ligamentum flavum hypertrophy creates central canal stenosis. Moderate L5-S1 facet hypertrophy. IMPRESSION: 1. L3 inferior endplate Schmorl's node, new from 2022 but age indeterminate. No significant loss of height. 2. Multilevel degenerative disc disease and facet hypertrophy. Electronically Signed   By: Chadwick Colonel M.D.   On: 07/11/2023 15:39   CT Cervical Spine Wo Contrast Result Date:  07/11/2023 CLINICAL DATA:  Neck trauma, unwitnessed fall. EXAM: CT CERVICAL SPINE WITHOUT CONTRAST TECHNIQUE: Multidetector CT imaging of the cervical spine was performed without intravenous contrast. Multiplanar CT image reconstructions were also generated. RADIATION DOSE REDUCTION: This exam was performed according to the departmental dose-optimization program which includes automated exposure control, adjustment of the mA and/or kV according to patient size and/or use of iterative reconstruction technique. COMPARISON:  None Available. FINDINGS: Alignment: No traumatic subluxation. Slight broad-based leftward curvature. Skull base and vertebrae: Mild T2 superior endplate compression fracture, age indeterminate. No evidence of acute cervical spine fracture. The dens and skull base are intact. Moderate C1-C2 degenerative change. Soft tissues and spinal canal: No prevertebral fluid or swelling particularly at the site of T2 compression deformity. No visible canal hematoma. Disc levels: Disc space narrowing and anterior spurring C4-C5, C5-C6 and C6-C7. Mild multilevel facet hypertrophy. Upper chest: Emphysema.  No acute findings. Other: Carotid calcifications. IMPRESSION: 1. Mild T2 superior endplate compression fracture, age indeterminate. Recommend correlation with point tenderness. 2. No acute cervical spine fracture. 3. Multilevel degenerative disc disease and facet hypertrophy. Electronically Signed   By: Chadwick Colonel M.D.   On: 07/11/2023 15:33   DG Chest Portable 1 View Result Date: 07/11/2023 CLINICAL DATA:  Syncope EXAM: PORTABLE CHEST 1 VIEW semi upright COMPARISON:  None Available. FINDINGS: Normal cardiopericardial silhouette. Calcified aorta. No pneumothorax, effusion. No consolidation. There is some interstitial changes bilaterally. Overlapping cardiac leads. Eventration of the right hemidiaphragm. Osteopenia. IMPRESSION: Calcified aorta. No consolidation. Likely chronic interstitial changes.  Electronically Signed   By: Adrianna Horde M.D.   On: 07/11/2023 13:26       Meaghan Whistler M.D. [facility] 07/12/2023, 11:13 AM  Available via Epic secure chat 7am-7pm After 7 pm, please refer to night coverage provider listed on amion.

## 2023-07-13 DIAGNOSIS — N3000 Acute cystitis without hematuria: Secondary | ICD-10-CM

## 2023-07-13 DIAGNOSIS — R531 Weakness: Secondary | ICD-10-CM | POA: Diagnosis not present

## 2023-07-13 LAB — CBC
HCT: 38 % (ref 36.0–46.0)
Hemoglobin: 12.4 g/dL (ref 12.0–15.0)
MCH: 30 pg (ref 26.0–34.0)
MCHC: 32.6 g/dL (ref 30.0–36.0)
MCV: 92 fL (ref 80.0–100.0)
Platelets: 212 10*3/uL (ref 150–400)
RBC: 4.13 MIL/uL (ref 3.87–5.11)
RDW: 14.4 % (ref 11.5–15.5)
WBC: 6.8 10*3/uL (ref 4.0–10.5)
nRBC: 0 % (ref 0.0–0.2)

## 2023-07-13 LAB — BASIC METABOLIC PANEL WITH GFR
Anion gap: 6 (ref 5–15)
BUN: 18 mg/dL (ref 8–23)
CO2: 24 mmol/L (ref 22–32)
Calcium: 8 mg/dL — ABNORMAL LOW (ref 8.9–10.3)
Chloride: 106 mmol/L (ref 98–111)
Creatinine, Ser: 0.69 mg/dL (ref 0.44–1.00)
GFR, Estimated: >60 mL/min (ref >60)
Glucose, Bld: 116 mg/dL — ABNORMAL HIGH (ref 70–99)
Potassium: 3.6 mmol/L (ref 3.5–5.1)
Sodium: 136 mmol/L (ref 135–145)

## 2023-07-13 MED ORDER — MELATONIN 5 MG PO TABS
5.0000 mg | ORAL_TABLET | Freq: Every evening | ORAL | Status: DC | PRN
  Administered 2023-07-13: 5 mg via ORAL

## 2023-07-13 NOTE — Progress Notes (Signed)
 Progress Note   Patient: Gabrielle Gonzales:[id number] DOB: [birth year] DOA: 07/11/2023     0 DOS: the patient was seen and examined on 07/13/2023   Brief hospital course: 83 year old female with HTN, hyperlipidemia, history of fall with pubic fracture in the past presented to ED with a fall a day before the admission.  Patient was supposed to use a walker but she did not have the walker in the room.  She had an unwitnessed fall.  Family heard the fall and got her up, she did not want to be seen in the ED. on the morning of admission next day, they noted that she was complaining of pain in her back and also more lethargic, foul-smelling urine.  Son did not know if she hit her head. In ED, UA was positive for UTI, chest x-ray negative for pneumonia.  CT did not show any traumatic injury, superior endplate T2 vertebral fracture of undetermined age  Assessment and Plan:   Ecoli UTI (urinary tract infection) -Follow urine culture and sensitivities. Thus far pos for ecoli -Pt reports hx of frequent UTI's. Pending sensitivities - Continue IV Rocephin for now -No leukocytosis    Fall at home, deconditioning - T2 endplate fracture of undetermined age, no point tenderness - PT OT recs for HHPT/OT   Subdural hematoma likely from the fall - CT head reviewed, showed subacute bilateral parafalcine subdural hematoma measuring up to 4 mm on the left and 3 mm on the right.  - Repeat head CT reviewed, no changes   Essential hypertension - BP was elevated in the ED, was placed on lisinopril  10 mg daily - now improved, continue to monitor   Dementia - Continue supportive care   Hyperlipidemia - Continue simvastatin    Hypokalemia - Replaced     Pressure injury of skin, POA     Pressure Injury 07/11/23 Buttocks Left Stage 2 -  Partial thickness loss of dermis presenting as a shallow open injury with a red, pink wound bed without slough. (Active)  07/11/23 1901  Location: Buttocks  Location  Orientation: Left  Staging: Stage 2 -  Partial thickness loss of dermis presenting as a shallow open injury with a red, pink wound bed without slough.  Wound Description (Comments):   Present on Admission: Yes  Dressing Type Foam - Lift dressing to assess site every shift 07/11/23 1947        Estimated body mass index is 21.19 kg/m as calculated from the following:   Height as of this encounter: 5\' 4"  (1.626 m).   Weight as of this encounter: 56 kg.    Subjective: Without complaints  Physical Exam: Vitals:   07/13/23 0442 07/13/23 1005 07/13/23 1009 07/13/23 1244  BP: (!) 141/90 (!) 103/92 (!) 103/92 126/88  Pulse: 70 77  75  Resp: 16   16  Temp: 98.6 F (37 C)   98.5 F (36.9 C)  TempSrc: Oral   Oral  SpO2: 95%   93%  Weight:      Height:       General exam: Awake, laying in bed, in nad Respiratory system: Normal respiratory effort, no wheezing Cardiovascular system: regular rate, s1, s2 Gastrointestinal system: Soft, nondistended, positive BS Central nervous system: CN2-12 grossly intact, strength intact Extremities: Perfused, no clubbing Skin: Normal skin turgor, no notable skin lesions seen Psychiatry: Mood normal // no visual hallucinations   Data Reviewed:  Labs reviewed: Na 136, K 3.6, Cr 0.69, WBC 6.8, Hgb 12.4  Family Communication: Pt  in room, family not at bedside  Disposition: Status is: Observation The patient remains OBS appropriate and will d/c before 2 midnights.  Planned Discharge Destination: Home with Home Health     Author: Cherylle Corwin, MD 07/13/2023 5:06 PM  For on call review [email or web address].

## 2023-07-13 NOTE — Hospital Course (Signed)
 83 year old female with HTN, hyperlipidemia, history of fall with pubic fracture in the past presented to ED with a fall a day before the admission.  Patient was supposed to use a walker but she did not have the walker in the room.  She had an unwitnessed fall.  Family heard the fall and got her up, she did not want to be seen in the ED. on the morning of admission next day, they noted that she was complaining of pain in her back and also more lethargic, foul-smelling urine.  Son did not know if she hit her head. In ED, UA was positive for UTI, chest x-ray negative for pneumonia.  CT did not show any traumatic injury, superior endplate T2 vertebral fracture of undetermined age

## 2023-07-13 NOTE — Progress Notes (Signed)
 Occupational Therapy Treatment Patient Details Name: ILEIGH METTLER MRN: [id number] DOB: 04/27/1940 Today's Date: 07/13/2023   History of present illness Bruna Dills is an 83 y o female s/p unwitnessed fall at home admitted with dx UTI and T 2 end plate fx of undeterminate age. CT head showed subdural hematoma. PMH: dementia, hx of pelvic fx,  hypokalemia, nicotine dep   OT comments  Pt received in recliner, pleasant and agreeable to work with OT. Mildly confused, oriented only to self and states she lives with her father. OT assists with combing back of hair (pt is able to comb sides but requires assist for tangles) and braiding to prevent matting. Oral care with min vcs for sequencing. Pt will require 24/7 assist due to cognitive impairments. Discharge recommendation appropriate, OT will continue to follow.       If plan is discharge home, recommend the following:  A little help with walking and/or transfers;A little help with bathing/dressing/bathroom;Assistance with cooking/housework;Direct supervision/assist for medications management;Direct supervision/assist for financial management;Assist for transportation;Help with stairs or ramp for entrance;Supervision due to cognitive status   Equipment Recommendations  Wheelchair (measurements OT);Wheelchair cushion (measurements OT);Tub/shower bench    Recommendations for Other Services      Precautions / Restrictions Precautions Precautions: Fall Recall of Precautions/Restrictions: Impaired Restrictions Weight Bearing Restrictions Per Provider Order: No       Mobility Bed Mobility               General bed mobility comments: NT recieved in recliner and left in recliner per pt request    Transfers                   General transfer comment: NT, pt polietely declines due to fagigue     Balance Overall balance assessment: Needs assistance, History of Falls Sitting-balance support: Feet supported Sitting  balance-Leahy Scale: Fair                                     ADL either performed or assessed with clinical judgement   ADL Overall ADL's : Needs assistance/impaired     Grooming: Sitting;Oral care;Brushing hair;Cueing for sequencing Grooming Details (indicate cue type and reason): in recliner, OT assists with combing back of hair and braiding to prevent tangles and mats. oral care completed with min vcs for sequencing                               General ADL Comments: pt in recliner, states she wants to remain in recliner     Communication Communication Communication: No apparent difficulties   Cognition Arousal: Alert Behavior During Therapy: Flat affect, WFL for tasks assessed/performed Cognition: History of cognitive impairments             OT - Cognition Comments: STM and safety deficits, sons both aware and provide 24/7 supervision and assist. Today, pt tells Bolivar Bushman she lives with her dad, and is unaware of where she is.                 Following commands: Impaired Following commands impaired: Follows one step commands inconsistently      Cueing   Cueing Techniques: Verbal cues             Pertinent Vitals/ Pain       Pain Assessment Pain Assessment: No/denies pain   Frequency  Min 2X/week        Progress Toward Goals  OT Goals(current goals can now be found in the care plan section)  Progress towards OT goals: Progressing toward goals  Acute Rehab OT Goals OT Goal Formulation: With patient/family Time For Goal Achievement: 07/26/23 Potential to Achieve Goals: Good ADL Goals Pt Will Perform Lower Body Bathing: with contact guard assist;sit to/from stand Pt Will Perform Lower Body Dressing: with contact guard assist;sit to/from stand Pt Will Transfer to Toilet: with supervision;ambulating Pt Will Perform Toileting - Clothing Manipulation and hygiene: sit to/from stand Pt Will Perform Tub/Shower Transfer: with  contact guard assist;tub bench;rolling walker Pt/caregiver will Perform Home Exercise Program: Both right and left upper extremity;Increased strength;Independently;With written HEP provided  Plan         AM-PAC OT "6 Clicks" Daily Activity     Outcome Measure   Help from another person eating meals?: A Little Help from another person taking care of personal grooming?: A Little Help from another person toileting, which includes using toliet, bedpan, or urinal?: A Little Help from another person bathing (including washing, rinsing, drying)?: A Little Help from another person to put on and taking off regular upper body clothing?: A Little Help from another person to put on and taking off regular lower body clothing?: A Little 6 Click Score: 18    End of Session    OT Visit Diagnosis: Unsteadiness on feet (R26.81);Muscle weakness (generalized) (M62.81);History of falling (Z91.81);Other symptoms and signs involving cognitive function   Activity Tolerance Patient tolerated treatment well   Patient Left with call bell/phone within reach;with chair alarm set;in chair   Nurse Communication Mobility status        Time: 4098-1191 OT Time Calculation (min): 13 min  Charges: OT General Charges $OT Visit: 1 Visit OT Treatments $Self Care/Home Management : 8-22 mins  Finnigan Warriner L. Korbin Mapps, OTR/L  07/13/23, 4:34 PM

## 2023-07-13 NOTE — Progress Notes (Signed)
 Physical Therapy Treatment Patient Details Name: Gabrielle Gonzales MRN: [id number] DOB: December 05, 1940 Today's Date: 07/13/2023   History of Present Illness Gabrielle Gonzales is an 83 y o female s/p unwitnessed fall at home admitted with dx UTI and T 2 end plate fx of undeterminate age. PMH: dementia, hx of pelvic fx,  hypokalemia, nicotine dep    PT Comments  Pt making gradual progress.  Needs CGA/min A for transfers and frequent cues for safe RW use.  Ambulated 20'x2 with RW and min A.  Further therapy limited due to meal arrived and pt wanting to eat.  Continue to recommend 24 hr supervision due to mobility level and cognition. Recommend HHPT.     If plan is discharge home, recommend the following: A little help with walking and/or transfers;A little help with bathing/dressing/bathroom;Assistance with cooking/housework;Help with stairs or ramp for entrance;Direct supervision/assist for medications management;Assist for transportation   Can travel by private vehicle        Equipment Recommendations  Wheelchair (measurements PT);Wheelchair cushion (measurements PT)    Recommendations for Other Services       Precautions / Restrictions Precautions Precautions: Fall     Mobility  Bed Mobility               General bed mobility comments: NT recieved in recliner and left in recliner per pt request    Transfers Overall transfer level: Needs assistance Equipment used: Rolling walker (2 wheels) Transfers: Sit to/from Stand Sit to Stand: Min assist           General transfer comment: Performed STS x 2 (from chair and toilet).  Min A with toielting ADLs    Ambulation/Gait Ambulation/Gait assistance: Min assist Gait Distance (Feet): 20 Feet (20'x2)   Gait Pattern/deviations: Trunk flexed, Step-to pattern Gait velocity: decreased     General Gait Details: Frequent cues to stay close to RW; min A for RW and CGA to steady; distance limited due to dinner arrived   Stairs              Wheelchair Mobility     Tilt Bed    Modified Rankin (Stroke Patients Only)       Balance Overall balance assessment: Needs assistance, History of Falls Sitting-balance support: Feet supported Sitting balance-Leahy Scale: Good     Standing balance support: Reliant on assistive device for balance, During functional activity, Bilateral upper extremity supported Standing balance-Leahy Scale: Poor Standing balance comment: CGA with RW                            Communication Communication Communication: No apparent difficulties  Cognition Arousal: Alert Behavior During Therapy: WFL for tasks assessed/performed   PT - Cognitive impairments: No family/caregiver present to determine baseline, Memory, Orientation, Problem solving                       PT - Cognition Comments: Pt stating "Is this my room?" when coming out of her bathroom.   Following commands impaired: Follows one step commands inconsistently    Cueing Cueing Techniques: Verbal cues  Exercises      General Comments        Pertinent Vitals/Pain Pain Assessment Pain Assessment: No/denies pain    Home Living                          Prior Function  PT Goals (current goals can now be found in the care plan section) Progress towards PT goals: Progressing toward goals    Frequency    Min 3X/week      PT Plan      Co-evaluation              AM-PAC PT "6 Clicks" Mobility   Outcome Measure  Help needed turning from your back to your side while in a flat bed without using bedrails?: A Little Help needed moving from lying on your back to sitting on the side of a flat bed without using bedrails?: A Little Help needed moving to and from a bed to a chair (including a wheelchair)?: A Little Help needed standing up from a chair using your arms (e.g., wheelchair or bedside chair)?: A Little Help needed to walk in hospital room?: A Little Help  needed climbing 3-5 steps with a railing? : A Lot 6 Click Score: 17    End of Session Equipment Utilized During Treatment: Gait belt Activity Tolerance: Patient tolerated treatment well;Other (comment) (Further therapy limited due to pt wanting to eat) Patient left: in chair;with call bell/phone within reach;with chair alarm set (assited with setting up meal) Nurse Communication: Mobility status PT Visit Diagnosis: Unsteadiness on feet (R26.81);History of falling (Z91.81)     Time: 1610-9604 PT Time Calculation (min) (ACUTE ONLY): 16 min  Charges:    $Gait Training: 8-22 mins PT General Charges $$ ACUTE PT VISIT: 1 Visit                     Gabrielle Gonzales, PT Acute Rehab [facility] Rehab [phone]    Gabrielle Gonzales 07/13/2023, 5:57 PM

## 2023-07-13 NOTE — TOC Progression Note (Signed)
 Transition of Care [facility]) - Progression Note    Patient Details  Name: LUGENE HITT MRN: [id number] Date of Birth: [birth year]  [facility]) CM/SW Contact  [name], RN Phone Number: 07/13/2023, 11:59 AM  Clinical Narrative:    CM spoke with patient and agreeable to [facility] PT. CM called Marjo Sievert son to offer choice. Will back back after lunch with choice.   Expected Discharge Plan: Home/Self Care Barriers to Discharge: Continued Medical Work up  Expected Discharge Plan and Services   Discharge Planning Services: CM Consult Post Acute Care Choice: NA Living arrangements for the past 2 months: Mobile Home                 DME Arranged: N/A DME Agency: NA       HH Arranged: NA HH Agency: NA         Social Determinants of Health (SDOH) Interventions SDOH Screenings   Food Insecurity: Patient Unable To Answer (07/11/2023)  Housing: Patient Unable To Answer (07/11/2023)  Transportation Needs: Patient Unable To Answer (07/11/2023)  Utilities: Patient Unable To Answer (07/11/2023)  Depression (PHQ2-9): Low Risk  (12/12/2018)  Tobacco Use: High Risk (07/11/2023)    Readmission Risk Interventions     No data to display

## 2023-07-14 DIAGNOSIS — N3000 Acute cystitis without hematuria: Secondary | ICD-10-CM | POA: Diagnosis not present

## 2023-07-14 DIAGNOSIS — R531 Weakness: Secondary | ICD-10-CM | POA: Diagnosis not present

## 2023-07-14 LAB — CBC
HCT: 36.5 % (ref 36.0–46.0)
Hemoglobin: 11.7 g/dL — ABNORMAL LOW (ref 12.0–15.0)
MCH: 29.8 pg (ref 26.0–34.0)
MCHC: 32.1 g/dL (ref 30.0–36.0)
MCV: 92.9 fL (ref 80.0–100.0)
Platelets: 204 10*3/uL (ref 150–400)
RBC: 3.93 MIL/uL (ref 3.87–5.11)
RDW: 14.3 % (ref 11.5–15.5)
WBC: 6.7 10*3/uL (ref 4.0–10.5)
nRBC: 0 % (ref 0.0–0.2)

## 2023-07-14 LAB — COMPREHENSIVE METABOLIC PANEL WITH GFR
ALT: 8 U/L (ref 0–44)
AST: 8 U/L — ABNORMAL LOW (ref 15–41)
Albumin: 2.8 g/dL — ABNORMAL LOW (ref 3.5–5.0)
Alkaline Phosphatase: 57 U/L (ref 38–126)
Anion gap: 9 (ref 5–15)
BUN: 12 mg/dL (ref 8–23)
CO2: 22 mmol/L (ref 22–32)
Calcium: 8.3 mg/dL — ABNORMAL LOW (ref 8.9–10.3)
Chloride: 106 mmol/L (ref 98–111)
Creatinine, Ser: 0.59 mg/dL (ref 0.44–1.00)
GFR, Estimated: >60 mL/min (ref >60)
Glucose, Bld: 108 mg/dL — ABNORMAL HIGH (ref 70–99)
Potassium: 3.6 mmol/L (ref 3.5–5.1)
Sodium: 137 mmol/L (ref 135–145)
Total Bilirubin: 0.8 mg/dL (ref 0.0–1.2)
Total Protein: 5.8 g/dL — ABNORMAL LOW (ref 6.5–8.1)

## 2023-07-14 LAB — URINE CULTURE: Culture: >=100000 — AB

## 2023-07-14 MED ORDER — POLYETHYLENE GLYCOL 3350 17 G PO PACK: 17.0000 g | PACK | Freq: Every day | ORAL | 0 refills | Status: DC | PRN

## 2023-07-14 MED ORDER — CEFADROXIL 500 MG PO CAPS: 500.0000 mg | ORAL_CAPSULE | Freq: Two times a day (BID) | ORAL | 0 refills | Status: AC

## 2023-07-14 MED ORDER — QUETIAPINE FUMARATE 25 MG PO TABS: 25.0000 mg | ORAL_TABLET | Freq: Every day | ORAL | 0 refills | Status: DC

## 2023-07-14 NOTE — TOC Transition Note (Signed)
 Transition of Care [facility]) - Discharge Note   Patient Details  Name: Gabrielle Gonzales MRN: [id number] Date of Birth: Nov 27, 1940  [facility]) CM/SW Contact:  [name], RN Phone Number: 07/14/2023, 2:32 PM   Clinical Narrative:    Patient being discharge home with [facility] PT. Patients son will transport home. TOC signing off    Final next level of care: Home w Home Health Services [phone] PT) Barriers to Discharge: Barriers Resolved   Patient Goals and CMS Choice Patient states their goals for this hospitalization and ongoing recovery are:: Home with sons   Choice offered to / list presented to : Adult Children, Patient [place] ownership interest in [facility]provided to:: Parent NA    Discharge Placement                       Discharge Plan and Services Additional resources added to the After Visit Summary for     Discharge Planning Services: CM Consult Post Acute Care Choice: Home Health          DME Arranged: N/A DME Agency: NA       HH Arranged: PT HH Agency: [facility] Date [facility] Agency Contacted: 07/14/23 Time HH Agency Contacted: 0840 Representative spoke with at [facility] Agency: Cleavon Curls  Social Drivers of Health (SDOH) Interventions SDOH Screenings   Food Insecurity: Patient Unable To Answer (07/11/2023)  Housing: Patient Unable To Answer (07/11/2023)  Transportation Needs: Patient Unable To Answer (07/11/2023)  Utilities: Patient Unable To Answer (07/11/2023)  Depression (PHQ2-9): Low Risk  (12/12/2018)  Tobacco Use: High Risk (07/11/2023)     Readmission Risk Interventions     No data to display

## 2023-07-14 NOTE — Progress Notes (Signed)
 RN spoke to [place], pt's son. Marjo Sievert stated that he would be at [facility] to pick up pt at approximately 4:30 pm this evening. RN will review and provide discharge instructions to pt's son upon arrival. RN will continue to monitor.

## 2023-07-14 NOTE — Progress Notes (Signed)
 RN escorted pt to main entrance to son's personal vehicle and read and reviewed discharge instructions. Son verbalized understanding.

## 2023-07-14 NOTE — TOC Progression Note (Signed)
 Transition of Care [facility]) - Progression Note    Patient Details  Name: Gabrielle Gonzales MRN: [id number] Date of Birth: December 21, 1940  Transition of Care [facility]) CM/SW Contact  [name], RN Phone Number: 07/14/2023, 8:46 AM  Clinical Narrative:    HH PT set up with [facility] Cleavon Curls). Added toAVS   Expected Discharge Plan: Home w Home Health Services Barriers to Discharge: Continued Medical Work up  Expected Discharge Plan and Services   Discharge Planning Services: CM Consult Post Acute Care Choice: Home Health Living arrangements for the past 2 months: Mobile Home                 DME Arranged: N/A DME Agency: NA       HH Arranged: PT HH Agency: [facility] Date HH Agency Contacted: 07/14/23 Time HH Agency Contacted: 0840 Representative spoke with at [facility] Agency: Cleavon Curls   Social Determinants of Health (SDOH) Interventions SDOH Screenings   Food Insecurity: Patient Unable To Answer (07/11/2023)  Housing: Patient Unable To Answer (07/11/2023)  Transportation Needs: Patient Unable To Answer (07/11/2023)  Utilities: Patient Unable To Answer (07/11/2023)  Depression (PHQ2-9): Low Risk  (12/12/2018)  Tobacco Use: High Risk (07/11/2023)    Readmission Risk Interventions     No data to display

## 2023-07-14 NOTE — Discharge Summary (Signed)
 Physician Discharge Summary   Patient: Gabrielle Gonzales MRN: [id number] DOB: 08-Jul-1940  Admit date:     07/11/2023  Discharge date: 07/14/23  Discharge Physician: Cherylle Corwin   PCP: [facility]   Recommendations at discharge:    Follow up with PCP in 1-2 weeks  Discharge Diagnoses: Principal Problem:   UTI (urinary tract infection) Active Problems:   Pressure injury of skin  Resolved Problems:   * No resolved hospital problems. *  Hospital Course: 83 year old female with HTN, hyperlipidemia, history of fall with pubic fracture in the past presented to ED with a fall a day before the admission.  Patient was supposed to use a walker but she did not have the walker in the room.  She had an unwitnessed fall.  Family heard the fall and got her up, she did not want to be seen in the ED. on the morning of admission next day, they noted that she was complaining of pain in her back and also more lethargic, foul-smelling urine.  Son did not know if she hit her head. In ED, UA was positive for UTI, chest x-ray negative for pneumonia.  CT did not show any traumatic injury, superior endplate T2 vertebral fracture of undetermined age  Assessment and Plan:  Ecoli UTI (urinary tract infection) -Follow urine culture and sensitivities. Thus far pos for ecoli -Pt reports hx of frequent UTI's.  - Was initially on IV Rocephin. Found to have pan-sensitive ecoli. Recommendation to complete course with Duracef on d/c -No leukocytosis    Fall at home, deconditioning - T2 endplate fracture of undetermined age, no point tenderness - PT OT recs for HHPT/OT   Subdural hematoma likely from the fall - CT head reviewed, showed subacute bilateral parafalcine subdural hematoma measuring up to 4 mm on the left and 3 mm on the right.  - Repeat head CT reviewed, no changes   Essential hypertension - BP was elevated in the ED, was placed on lisinopril  10 mg daily - now improved, continue to monitor    Dementia - Continue supportive care   Hyperlipidemia - Continue simvastatin    Hypokalemia - Replaced   Dementia -Discussed case with pt's son who reports issues with day/night reversal at home -EKG reviewed. QTc is unremarkable -Recommend low dose seroquel 25mg  QHS       Consultants:  Procedures performed:   Disposition: Home Diet recommendation:  Regular diet DISCHARGE MEDICATION: Allergies as of 07/14/2023       Reactions   Atorvastatin Other (See Comments)   Headache   Niacin Other (See Comments)   Skin flushed and felt like it was crawling        Medication List     STOP taking these medications    Enalapril -hydroCHLOROthiazide  5-12.5 MG tablet   HYDROcodone -acetaminophen  5-325 MG tablet Commonly known as: Norco   PARoxetine  40 MG tablet Commonly known as: PAXIL    permethrin  5 % cream Commonly known as: ELIMITE    potassium chloride  SA 20 MEQ tablet Commonly known as: KLOR-CON  M       TAKE these medications    cefadroxil 500 MG capsule Commonly known as: DURICEF Take 1 capsule (500 mg total) by mouth 2 (two) times daily for 2 days.   lisinopril -hydrochlorothiazide  10-12.5 MG tablet Commonly known as: ZESTORETIC  Take 1 tablet by mouth daily.   polyethylene glycol 17 g packet Commonly known as: MIRALAX / GLYCOLAX Take 17 g by mouth daily as needed for mild constipation.   QUEtiapine 25 MG tablet  Commonly known as: SEROquel Take 1 tablet (25 mg total) by mouth at bedtime.   simvastatin  40 MG tablet Commonly known as: ZOCOR  Take 1 tablet (40 mg total) by mouth daily at 6 PM.        Follow-up Information     [facility] Follow up.   Why: Will call to set up visit. Contact information: [address] [place] [address] [phone]                Discharge Exam: Filed Weights   07/11/23 1823  Weight: 56 kg   General exam: Awake, laying in bed, in nad Respiratory system: Normal  respiratory effort, no wheezing Cardiovascular system: regular rate, s1, s2 Gastrointestinal system: Soft, nondistended, positive BS Central nervous system: CN2-12 grossly intact, strength intact Extremities: Perfused, no clubbing Skin: Normal skin turgor, no notable skin lesions seen Psychiatry: Mood normal // no visual hallucinations   Condition at discharge: fair  The results of significant diagnostics from this hospitalization (including imaging, microbiology, ancillary and laboratory) are listed below for reference.   Imaging Studies: CT HEAD WO CONTRAST ( ) Result Date: 07/12/2023 CLINICAL DATA:  Head trauma EXAM: CT HEAD WITHOUT CONTRAST TECHNIQUE: Contiguous axial images were obtained from the base of the skull through the vertex without intravenous contrast. RADIATION DOSE REDUCTION: This exam was performed according to the departmental dose-optimization program which includes automated exposure control, adjustment of the mA and/or kV according to patient size and/or use of iterative reconstruction technique. COMPARISON:  07/11/2023 FINDINGS: Brain: There is no mass, hemorrhage or extra-axial collection. The appearance of the white matter is normal for the patient's age. There is generalized atrophy. 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 Vascular: No hyperdense vessel or unexpected vascular calcification. Skull: The visualized skull base, calvarium and extracranial soft tissues are normal. Sinuses/Orbits: No fluid levels or advanced mucosal thickening of the visualized paranasal sinuses. No mastoid or middle ear effusion. Normal orbits. Other: Unchanged left parietal scalp lesion. IMPRESSION: 1. No acute intracranial abnormality. 2. Unchanged left parietal scalp lesion. Electronically Signed   By: Juanetta Nordmann M.D.   On: 07/12/2023 21:36   CT Head Wo Contrast Result Date: 07/11/2023 CLINICAL DATA:  Head trauma, minor (Age >= 65y) fall EXAM: CT HEAD  WITHOUT CONTRAST TECHNIQUE: Contiguous axial images were obtained from the base of the skull through the vertex without intravenous contrast. RADIATION DOSE REDUCTION: This exam was performed according to the departmental dose-optimization program which includes automated exposure control, adjustment of the mA and/or kV according to patient size and/or use of iterative reconstruction technique. COMPARISON:  None Available. FINDINGS: Brain: Cerebral ventricle sizes are concordant with the degree of cerebral volume loss. Patchy and confluent areas of decreased attenuation are noted throughout the deep and periventricular white matter of the cerebral hemispheres bilaterally, compatible with chronic microvascular ischemic disease. no evidence of large-territorial acute infarction. No parenchymal hemorrhage. No mass lesion. No extra-axial collection. Hypodense thickening of the falx cerebri bilaterally measuring up to 4 mm on the left and 3 mm on the right (5:9, 32). 2.2 cm fluid density right calvarial convexity lentiform lesion along the frontotemporal lobes with associated mass effect. No mass effect or midline shift. No hydrocephalus. Basilar cisterns are patent. Vascular: No hyperdense vessel. Atherosclerotic calcifications are present within the cavernous internal carotid arteries. Skull: No acute fracture or focal lesion. Sinuses/Orbits: Paranasal sinuses and mastoid air cells are clear. The orbits are unremarkable. Other: A 3  x 1 cm peripherally calcified left parieto-occipital scalp soft tissue density lesion with surrounding edema. IMPRESSION: 1. No acute intracranial abnormality. 2. Subacute bilateral parafalcine subdural hematoma measuring up to 4 mm on the left and 3 mm on the right. 3. 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 4. A 3 x 1 cm peripherally calcified left parieto-occipital scalp soft tissue density lesion with surrounding edema. Correlate with physical exam Electronically Signed   By:  Morgane  Naveau M.D.   On: 07/11/2023 17:46   CT ABDOMEN PELVIS W CONTRAST Result Date: 07/11/2023 CLINICAL DATA:  Unwitnessed fall today.  Lower abdominal pain. EXAM: CT ABDOMEN AND PELVIS WITH CONTRAST TECHNIQUE: Multidetector CT imaging of the abdomen and pelvis was performed using the standard protocol following bolus administration of intravenous contrast. RADIATION DOSE REDUCTION: This exam was performed according to the departmental dose-optimization program which includes automated exposure control, adjustment of the mA and/or kV according to patient size and/or use of iterative reconstruction technique. CONTRAST:  OMNIPAQUE  IOHEXOL  300 MG/ML  SOLN COMPARISON:  None Available. FINDINGS: Lower chest:  Mild dependent atelectasis in both lung bases. Hepatobiliary: No hepatic laceration or mass identified. No evidence of acute cholecystitis or biliary obstruction. Pancreas: No parenchymal laceration, mass, or inflammatory changes identified. Spleen: No evidence of splenic laceration. Adrenal/Urinary Tract: No hemorrhage or parenchymal lacerations identified. No evidence of suspicious renal masses or hydronephrosis. Mild diffuse bladder wall thickening again seen, most likely due to cystitis. Stomach/Bowel: Unopacified bowel loops are unremarkable in appearance. No evidence of hemoperitoneum. Large stool burden seen throughout the colon. Diverticulosis is seen mainly involving the sigmoid colon, however there is no evidence of diverticulitis. Vascular/Lymphatic: No evidence of abdominal aortic injury. 4.0 cm saccular infrarenal abdominal aortic aneurysm noted. No evidence of aneurysm leak or rupture. No pathologically enlarged lymph nodes identified. Reproductive:  No mass or other significant abnormality identified. Other:  None. Musculoskeletal: No acute fractures or suspicious bone lesions identified. Old fracture deformity of the left ischial tuberosity again noted. IMPRESSION: No evidence of acute  traumatic injury. Mild diffuse bladder wall thickening, likely due to cystitis. Colonic diverticulosis, without radiographic evidence of diverticulitis. Large stool burden noted; recommend clinical correlation for possible constipation. 4.0 cm saccular infrarenal abdominal aortic aneurysm. Recommend follow-up CT or MR as appropriate in 12 months and referral to or continued care with vascular specialist. (Ref.: J Vasc Surg. 2018; 67:2-77 and J Am Coll Radiol 2013;10(10):789-794.) Electronically Signed   By: Marlyce Sine M.D.   On: 07/11/2023 15:43   CT L-SPINE NO CHARGE Result Date: 07/11/2023 CLINICAL DATA:  Unwitnessed fall. EXAM: CT LUMBAR SPINE WITHOUT CONTRAST TECHNIQUE: Multidetector CT imaging of the lumbar spine was performed without intravenous contrast administration. Multiplanar CT image reconstructions were also generated. RADIATION DOSE REDUCTION: This exam was performed according to the departmental dose-optimization program which includes automated exposure control, adjustment of the mA and/or kV according to patient size and/or use of iterative reconstruction technique. COMPARISON:  Included portion from pelvic CT 02/13/2021 FINDINGS: Segmentation: 5 lumbar type vertebrae. Alignment: Minor anterolisthesis of L4 on L5. Vertebrae: L3 inferior endplate Schmorl's node, new from 2022. No significant loss of height. Posterior elements are intact. Paraspinal and other soft tissues: Concurrent abdominopelvic CT for complete assessment. There is no paraspinal hematoma. Disc levels: Mild broad-based disc bulge at L2-L3. Moderate broad-based disc bulge at L3-L4 and L4-L5 with ligamentum flavum hypertrophy creates central canal stenosis. Moderate L5-S1 facet hypertrophy. IMPRESSION: 1. L3 inferior endplate Schmorl's node, new from 2022 but age indeterminate.  No significant loss of height. 2. Multilevel degenerative disc disease and facet hypertrophy. Electronically Signed   By: Chadwick Colonel M.D.   On:  07/11/2023 15:39   CT Cervical Spine Wo Contrast Result Date: 07/11/2023 CLINICAL DATA:  Neck trauma, unwitnessed fall. EXAM: CT CERVICAL SPINE WITHOUT CONTRAST TECHNIQUE: Multidetector CT imaging of the cervical spine was performed without intravenous contrast. Multiplanar CT image reconstructions were also generated. RADIATION DOSE REDUCTION: This exam was performed according to the departmental dose-optimization program which includes automated exposure control, adjustment of the mA and/or kV according to patient size and/or use of iterative reconstruction technique. COMPARISON:  None Available. FINDINGS: Alignment: No traumatic subluxation. Slight broad-based leftward curvature. Skull base and vertebrae: Mild T2 superior endplate compression fracture, age indeterminate. No evidence of acute cervical spine fracture. The dens and skull base are intact. Moderate C1-C2 degenerative change. Soft tissues and spinal canal: No prevertebral fluid or swelling particularly at the site of T2 compression deformity. No visible canal hematoma. Disc levels: Disc space narrowing and anterior spurring C4-C5, C5-C6 and C6-C7. Mild multilevel facet hypertrophy. Upper chest: Emphysema.  No acute findings. Other: Carotid calcifications. IMPRESSION: 1. Mild T2 superior endplate compression fracture, age indeterminate. Recommend correlation with point tenderness. 2. No acute cervical spine fracture. 3. Multilevel degenerative disc disease and facet hypertrophy. Electronically Signed   By: Chadwick Colonel M.D.   On: 07/11/2023 15:33   DG Chest Portable 1 View Result Date: 07/11/2023 CLINICAL DATA:  Syncope EXAM: PORTABLE CHEST 1 VIEW semi upright COMPARISON:  None Available. FINDINGS: Normal cardiopericardial silhouette. Calcified aorta. No pneumothorax, effusion. No consolidation. There is some interstitial changes bilaterally. Overlapping cardiac leads. Eventration of the right hemidiaphragm. Osteopenia. IMPRESSION: Calcified  aorta. No consolidation. Likely chronic interstitial changes. Electronically Signed   By: Adrianna Horde M.D.   On: 07/11/2023 13:26    Microbiology: Results for orders placed or performed during the hospital encounter of 07/11/23  Urine Culture     Status: Abnormal   Collection Time: 07/11/23  2:21 PM   Specimen: Urine, Random  Result Value Ref Range Status   Specimen Description   Final    URINE, RANDOM Performed at [facility], [address] [place], [place] [address]    Special Requests   Final    NONE Performed at [facility], [address] [place], [place] [address]    Culture >=100,000 COLONIES/mL ESCHERICHIA COLI (A)  Final   Report Status 07/14/2023 FINAL  Final   Organism ID, Bacteria ESCHERICHIA COLI (A)  Final      Susceptibility   Escherichia coli - MIC*    AMPICILLIN <=2 SENSITIVE Sensitive     CEFAZOLIN <=4 SENSITIVE Sensitive     CEFEPIME <=0.12 SENSITIVE Sensitive     CEFTRIAXONE  <=0.25 SENSITIVE Sensitive     CIPROFLOXACIN <=0.25 SENSITIVE Sensitive     GENTAMICIN <=1 SENSITIVE Sensitive     IMIPENEM <=0.25 SENSITIVE Sensitive     NITROFURANTOIN <=16 SENSITIVE Sensitive     TRIMETH/SULFA <=20 SENSITIVE Sensitive     AMPICILLIN/SULBACTAM <=2 SENSITIVE Sensitive     PIP/TAZO <=4 SENSITIVE Sensitive ug/mL    * >=100,000 COLONIES/mL ESCHERICHIA COLI    Labs: CBC: Recent Labs  Lab 07/11/23 1345 07/11/23 1352 07/11/23 1805 07/12/23 0359 07/13/23 0435 07/14/23 0429  WBC 7.4  --  7.5 7.3 6.8 6.7  HGB 16.5* 17.0* 15.9* 13.8 12.4 11.7*  HCT 50.3* 50.0* 49.4* 42.7 38.0 36.5  MCV 91.8  --  91.7 93.4  92.0 92.9  PLT 245  --  247 211 212 204   Basic Metabolic Panel: Recent Labs  Lab 07/11/23 1345 07/11/23 1352 07/11/23 1805 07/12/23 0359 07/13/23 0435 07/14/23 0429  NA 139 140  --  136 136 137  K 3.1* 3.6  --  3.1* 3.6 3.6  CL 103 102  --  105 106 106  CO2 24  --   --  20* 24 22  GLUCOSE 86 84  --  122* 116*  108*  BUN 13 13  --  17 18 12   CREATININE 0.60 0.70 0.74 0.57 0.69 0.59  CALCIUM 8.7*  --   --  8.4* 8.0* 8.3*   Liver Function Tests: Recent Labs  Lab 07/11/23 1345 07/12/23 0359 07/14/23 0429  AST 13* 10* 8*  ALT 9 7 8   ALKPHOS 81 63 57  BILITOT 1.5* 1.5* 0.8  PROT 7.6 5.8* 5.8*  ALBUMIN 3.7 2.9* 2.8*   CBG: No results for input(s): "GLUCAP" in the last 168 hours.  Discharge time spent: less than 30 minutes.  Signed: Cherylle Corwin, MD [facility] 07/14/2023

## 2023-11-05 ENCOUNTER — Observation Stay (HOSPITAL_COMMUNITY)
Admission: EM | Admit: 2023-11-05 | Discharge: 2023-11-08 | Disposition: A | Discharge status: Skilled Nursing Facility | Attending: Internal Medicine | Admitting: Internal Medicine

## 2023-11-05 ENCOUNTER — Emergency Department (HOSPITAL_COMMUNITY)

## 2023-11-05 ENCOUNTER — Encounter (HOSPITAL_COMMUNITY): Payer: Self-pay

## 2023-11-05 DIAGNOSIS — R030 Elevated blood-pressure reading, without diagnosis of hypertension: Secondary | ICD-10-CM | POA: Insufficient documentation

## 2023-11-05 DIAGNOSIS — F10920 Alcohol use, unspecified with intoxication, uncomplicated: Secondary | ICD-10-CM | POA: Insufficient documentation

## 2023-11-05 DIAGNOSIS — R296 Repeated falls: Secondary | ICD-10-CM | POA: Insufficient documentation

## 2023-11-05 DIAGNOSIS — F0390 Unspecified dementia without behavioral disturbance: Secondary | ICD-10-CM | POA: Diagnosis not present

## 2023-11-05 DIAGNOSIS — R4182 Altered mental status, unspecified: Principal | ICD-10-CM

## 2023-11-05 DIAGNOSIS — W19XXXA Unspecified fall, initial encounter: Principal | ICD-10-CM

## 2023-11-05 DIAGNOSIS — G9341 Metabolic encephalopathy: Secondary | ICD-10-CM | POA: Diagnosis not present

## 2023-11-05 DIAGNOSIS — N390 Urinary tract infection, site not specified: Secondary | ICD-10-CM | POA: Diagnosis not present

## 2023-11-05 HISTORY — DX: Unspecified visual loss: H54.7

## 2023-11-05 HISTORY — DX: Unspecified dementia, unspecified severity, without behavioral disturbance, psychotic disturbance, mood disturbance, and anxiety: F03.90

## 2023-11-05 LAB — URINALYSIS, W/ REFLEX TO CULTURE (INFECTION SUSPECTED)
Bilirubin Urine: NEGATIVE
Glucose, UA: NEGATIVE mg/dL
Hgb urine dipstick: NEGATIVE
Ketones, ur: 5 mg/dL — AB
Nitrite: POSITIVE — AB
Protein, ur: 30 mg/dL — AB
Specific Gravity, Urine: 1.017 (ref 1.005–1.030)
pH: 5 (ref 5.0–8.0)

## 2023-11-05 LAB — CBC WITH DIFFERENTIAL/PLATELET
Abs Immature Granulocytes: 0.04 K/uL (ref 0.00–0.07)
Basophils Absolute: 0.1 K/uL (ref 0.0–0.1)
Basophils Relative: 1 %
Eosinophils Absolute: 0.1 K/uL (ref 0.0–0.5)
Eosinophils Relative: 1 %
HCT: 36.4 % (ref 36.0–46.0)
Hemoglobin: 11.9 g/dL — ABNORMAL LOW (ref 12.0–15.0)
Immature Granulocytes: 1 %
Lymphocytes Relative: 26 %
Lymphs Abs: 2.2 K/uL (ref 0.7–4.0)
MCH: 29.1 pg (ref 26.0–34.0)
MCHC: 32.7 g/dL (ref 30.0–36.0)
MCV: 89 fL (ref 80.0–100.0)
Monocytes Absolute: 0.6 K/uL (ref 0.1–1.0)
Monocytes Relative: 7 %
Neutro Abs: 5.4 K/uL (ref 1.7–7.7)
Neutrophils Relative %: 64 %
Platelets: 239 K/uL (ref 150–400)
RBC: 4.09 MIL/uL (ref 3.87–5.11)
RDW: 16 % — ABNORMAL HIGH (ref 11.5–15.5)
WBC: 8.3 K/uL (ref 4.0–10.5)
nRBC: 0 % (ref 0.0–0.2)

## 2023-11-05 LAB — BASIC METABOLIC PANEL WITH GFR
Anion gap: 13 (ref 5–15)
BUN: 11 mg/dL (ref 8–23)
CO2: 24 mmol/L (ref 22–32)
Calcium: 8.3 mg/dL — ABNORMAL LOW (ref 8.9–10.3)
Chloride: 103 mmol/L (ref 98–111)
Creatinine, Ser: 0.8 mg/dL (ref 0.44–1.00)
GFR, Estimated: >60 mL/min (ref >60)
Glucose, Bld: 79 mg/dL (ref 70–99)
Potassium: 2.8 mmol/L — ABNORMAL LOW (ref 3.5–5.1)
Sodium: 140 mmol/L (ref 135–145)

## 2023-11-05 MED ORDER — ENOXAPARIN SODIUM 40 MG/0.4ML IJ SOSY
40.0000 mg | PREFILLED_SYRINGE | INTRAMUSCULAR | Status: DC
  Administered 2023-11-05: 40 mg via SUBCUTANEOUS
  Filled 2023-11-05: qty 0.4

## 2023-11-05 MED ORDER — POTASSIUM CHLORIDE CRYS ER 20 MEQ PO TBCR
20.0000 meq | EXTENDED_RELEASE_TABLET | Freq: Three times a day (TID) | ORAL | Status: AC
  Administered 2023-11-05 – 2023-11-06 (×2): 20 meq via ORAL
  Filled 2023-11-05: qty 1

## 2023-11-05 MED ORDER — ACETAMINOPHEN 325 MG PO TABS: 650.0000 mg | ORAL_TABLET | Freq: Four times a day (QID) | ORAL | Status: DC | PRN

## 2023-11-05 MED ORDER — SODIUM CHLORIDE 0.9 % IV BOLUS
1000.0000 mL | Freq: Once | INTRAVENOUS | Status: AC
  Administered 2023-11-05: 1000 mL via INTRAVENOUS

## 2023-11-05 MED ORDER — PROCHLORPERAZINE EDISYLATE 10 MG/2ML IJ SOLN: 5.0000 mg | Freq: Four times a day (QID) | INTRAMUSCULAR | Status: DC | PRN

## 2023-11-05 MED ORDER — POLYETHYLENE GLYCOL 3350 17 G PO PACK: 17.0000 g | PACK | Freq: Every day | ORAL | Status: DC | PRN

## 2023-11-05 MED ORDER — POTASSIUM CHLORIDE 2 MEQ/ML IV SOLN
INTRAVENOUS | Status: AC
  Filled 2023-11-05 (×2): qty 1000

## 2023-11-05 MED ORDER — MELATONIN 5 MG PO TABS: 5.0000 mg | ORAL_TABLET | Freq: Every evening | ORAL | Status: DC | PRN

## 2023-11-05 MED ORDER — SODIUM CHLORIDE 0.9 % IV SOLN
1.0000 g | INTRAVENOUS | Status: DC
  Administered 2023-11-06: 1 g via INTRAVENOUS
  Filled 2023-11-05 (×2): qty 10

## 2023-11-05 MED ORDER — CEFTRIAXONE SODIUM 1 G IJ SOLR
1.0000 g | Freq: Once | INTRAMUSCULAR | Status: AC
  Administered 2023-11-05: 1 g via INTRAVENOUS
  Filled 2023-11-05: qty 10

## 2023-11-05 NOTE — ED Notes (Signed)
 Call placed to CCMD

## 2023-11-05 NOTE — ED Provider Notes (Signed)
 [place] EMERGENCY DEPARTMENT AT [facility] Provider Note   CSN: [id number] Arrival date & time: 11/05/23  8197     Patient presents with: Gabrielle Gonzales is a 83 y.o. female.  With a history of dementia and UTIs who presents to the ED after fall.  Patient lives at home with her 2 sons.  Her son voices concern for increased generalized confusion over the last week.  2 unwitnessed falls at home today.  She is typically able to converse but has been unable to do so today.  She does walk short distances on her own at home.  Son reports she has had acute confusion related to UTIs in the past.  Patient unable to contribute additional history at this time secondary to clinical condition    Fall       Prior to Admission medications   Medication Sig Start Date End Date Taking? Authorizing Provider  QUEtiapine  (SEROQUEL ) 25 MG tablet Take 1 tablet (25 mg total) by mouth at bedtime. 07/14/23 08/13/23  Cindy Garnette POUR, MD  simvastatin  (ZOCOR ) 40 MG tablet Take 1 tablet (40 mg total) by mouth daily at 6 PM. Patient not taking: Reported on 07/11/2023 01/06/18 07/11/23  Sagardia, Miguel Jose, MD    Allergies: Atorvastatin and Niacin    Review of Systems  Updated Vital Signs BP (!) 150/103   Pulse 70   Temp (!) 97.5 F (36.4 C) (Oral)   Resp 12   LMP  (LMP Unknown)   SpO2 97%   Physical Exam Vitals and nursing note reviewed.  HENT:     Head: Normocephalic and atraumatic.  Eyes:     Pupils: Pupils are equal, round, and reactive to light.  Cardiovascular:     Rate and Rhythm: Normal rate and regular rhythm.  Pulmonary:     Effort: Pulmonary effort is normal.     Breath sounds: Normal breath sounds.  Abdominal:     Palpations: Abdomen is soft.     Tenderness: There is no abdominal tenderness.  Musculoskeletal:     Cervical back: No tenderness.     Comments: Pelvis stable to rocking compression No midline tenderness step-off deformity back   Skin:    General:  Skin is warm and dry.  Neurological:     Mental Status: She is alert.     Comments: Oriented to self order Able to move bilateral upper and lower extremities with equal strength Sensation tact light touch throughout  Psychiatric:        Mood and Affect: Mood normal.     (all labs ordered are listed, but only abnormal results are displayed) Labs Reviewed  BASIC METABOLIC PANEL WITH GFR - Abnormal; Notable for the following components:      Result Value   Potassium 2.8 (*)    Calcium 8.3 (*)    All other components within normal limits  CBC WITH DIFFERENTIAL/PLATELET - Abnormal; Notable for the following components:   Hemoglobin 11.9 (*)    RDW 16.0 (*)    All other components within normal limits  URINALYSIS, W/ REFLEX TO CULTURE (INFECTION SUSPECTED) - Abnormal; Notable for the following components:   Color, Urine AMBER (*)    APPearance HAZY (*)    Ketones, ur 5 (*)    Protein, ur 30 (*)    Nitrite POSITIVE (*)    Leukocytes,Ua MODERATE (*)    Bacteria, UA MANY (*)    All other components within normal limits  URINE CULTURE  CBC  BASIC METABOLIC PANEL WITH GFR  MAGNESIUM   PHOSPHORUS    EKG: None  Radiology: CT Head Wo Contrast Result Date: 11/05/2023 CLINICAL DATA:  Head and neck trauma EXAM: CT HEAD WITHOUT CONTRAST CT CERVICAL SPINE WITHOUT CONTRAST TECHNIQUE: Multidetector CT imaging of the head and cervical spine was performed following the standard protocol without intravenous contrast. Multiplanar CT image reconstructions of the cervical spine were also generated. RADIATION DOSE REDUCTION: This exam was performed according to the departmental dose-optimization program which includes automated exposure control, adjustment of the mA and/or kV according to patient size and/or use of iterative reconstruction technique. COMPARISON:  CT brain 07/12/2023, CT brain and cervical spine 07/11/2023 FINDINGS: CT HEAD FINDINGS Brain: No acute territorial infarction or hemorrhage  is visualized. Focal CSF density over the right temporoparietal region with mass effect, this measures 4.9 cm AP by 2 cm transverse and is grossly stable. 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 The ventricles are mildly prominent but grossly stable in size. There is atrophy and chronic small vessel ischemic changes of the white matter. Suspect small chronic infarcts in the left thalamus. Vascular: No hyperdense vessels.  Carotid vascular calcification. Skull: Normal. Negative for fracture or focal lesion. Sinuses/Orbits: No acute finding. Other: Left posterior partially calcified scalp lesion measures slightly larger at 3.2 cm compared with 2.8 cm previously CT CERVICAL SPINE FINDINGS Alignment: Straightening of the cervical spine. No subluxation. Facet alignment is within normal limits. Skull base and vertebrae: No acute fracture. No primary bone lesion or focal pathologic process. Soft tissues and spinal canal: No prevertebral fluid or swelling. No visible canal hematoma. Disc levels: Moderate disc space narrowing C4-C5, C5-C6. Multilevel facet degenerative changes. Upper chest: Apical emphysema.  Carotid vascular calcification Other: None IMPRESSION: 1. No CT evidence for acute intracranial abnormality. Atrophy and chronic small vessel ischemic changes of the white matter. 2. 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 More focal CSF density over the right temporoparietal region is unchanged and may reflect arachnoid cyst or chronic focal hygroma 3. Straightening of the cervical spine with degenerative changes. No acute osseous abnormality. Electronically Signed   By: Luke Bun M.D.   On: 11/05/2023 20:35   CT Cervical Spine Wo Contrast Result Date: 11/05/2023 CLINICAL DATA:  Head and neck trauma EXAM: CT HEAD WITHOUT CONTRAST CT  CERVICAL SPINE WITHOUT CONTRAST TECHNIQUE: Multidetector CT imaging of the head and cervical spine was performed following the standard protocol without intravenous contrast. Multiplanar CT image reconstructions of the cervical spine were also generated. RADIATION DOSE REDUCTION: This exam was performed according to the departmental dose-optimization program which includes automated exposure control, adjustment of the mA and/or kV according to patient size and/or use of iterative reconstruction technique. COMPARISON:  CT brain 07/12/2023, CT brain and cervical spine 07/11/2023 FINDINGS: CT HEAD FINDINGS Brain: No acute territorial infarction or hemorrhage is visualized. Focal CSF density over the right temporoparietal region with mass effect, this measures 4.9 cm AP by 2 cm transverse and is grossly stable. 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 The ventricles are mildly prominent but grossly stable in size. There is atrophy and chronic small vessel ischemic changes of the white matter. Suspect small chronic infarcts in the left thalamus. Vascular: No hyperdense vessels.  Carotid vascular calcification. Skull:  Normal. Negative for fracture or focal lesion. Sinuses/Orbits: No acute finding. Other: Left posterior partially calcified scalp lesion measures slightly larger at 3.2 cm compared with 2.8 cm previously CT CERVICAL SPINE FINDINGS Alignment: Straightening of the cervical spine. No subluxation. Facet alignment is within normal limits. Skull base and vertebrae: No acute fracture. No primary bone lesion or focal pathologic process. Soft tissues and spinal canal: No prevertebral fluid or swelling. No visible canal hematoma. Disc levels: Moderate disc space narrowing C4-C5, C5-C6. Multilevel facet degenerative changes. Upper chest: Apical emphysema.  Carotid vascular calcification Other: None IMPRESSION: 1. No CT evidence  for acute intracranial abnormality. Atrophy and chronic small vessel ischemic changes of the white matter. 2. 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 More focal CSF density over the right temporoparietal region is unchanged and may reflect arachnoid cyst or chronic focal hygroma 3. Straightening of the cervical spine with degenerative changes. No acute osseous abnormality. Electronically Signed   By: Luke Bun M.D.   On: 11/05/2023 20:35   DG Pelvis Portable Result Date: 11/05/2023 CLINICAL DATA:  Fall EXAM: PORTABLE PELVIS 1-2 VIEWS COMPARISON:  None Available. FINDINGS: No acute bony abnormality. Specifically, no fracture, subluxation, or dislocation. Hip joints and SI joints symmetric. Diffuse osteopenia. IMPRESSION: No acute bony abnormality. Electronically Signed   By: Franky Crease M.D.   On: 11/05/2023 19:29   DG Chest Portable 1 View Result Date: 11/05/2023 CLINICAL DATA:  Fall EXAM: PORTABLE CHEST 1 VIEW COMPARISON:  07/11/2023 FINDINGS: Heart and mediastinal contours within normal limits. Aortic atherosclerosis. Bibasilar atelectasis. No effusions. No acute bony abnormality. IMPRESSION: Bibasilar atelectasis. Electronically Signed   By: Franky Crease M.D.   On: 11/05/2023 19:28     Procedures   Medications Ordered in the ED  enoxaparin  (LOVENOX ) injection 40 mg (has no administration in time range)  cefTRIAXone  (ROCEPHIN ) 1 g in sodium chloride  0.9 % 100 mL IVPB (has no administration in time range)  acetaminophen  (TYLENOL ) tablet 650 mg (has no administration in time range)  prochlorperazine  (COMPAZINE ) injection 5 mg (has no administration in time range)  polyethylene glycol (MIRALAX  / GLYCOLAX ) packet 17 g (has no administration in time range)  melatonin tablet 5 mg (has no administration in time range)  lactated ringers  1,000 mL with potassium chloride  40 mEq infusion (has no administration in  time range)  potassium chloride  SA (KLOR-CON  M) CR tablet 20 mEq (has no administration in time range)  cefTRIAXone  (ROCEPHIN ) 1 g in sodium chloride  0.9 % 100 mL IVPB (0 g Intravenous Stopped 11/05/23 2146)  sodium chloride  0.9 % bolus 1,000 mL (0 mLs Intravenous Stopped 11/05/23 2147)                                    Medical Decision Making 83 year old female with history as above presenting to the ED after multiple unwitnessed falls at home.  She has been falling more often and weaker than usual per son.  Increased generalized confusion from baseline dementia.  Afebrile slightly hypertensive here.  Oriented to self only.  No external evidence of trauma but unwitnessed falls concerning for potential traumatic injury.  CT head C-spine chest x-ray pelvic x-ray revealed no acute trauma.  Laboratory workup notable for UTI.  This likely represents the underlying cause for her worsening confusion and weakness.  Will cover with ceftriaxone  provide IV fluids for  rehydration.  She will require admission for resolution of the UTI and likely skilled nursing facility placement thereafter as son feels he can no longer manage her at home  Amount and/or Complexity of Data Reviewed Labs: ordered. Radiology: ordered.  Risk Decision regarding hospitalization.        Final diagnoses:  Fall, initial encounter  Urinary tract infection without hematuria, site unspecified  Altered mental status, unspecified altered mental status type    ED Discharge Orders     None          Pamella Ozell LABOR, DO 11/05/23 2206

## 2023-11-05 NOTE — ED Triage Notes (Addendum)
 BIB EMS from home r/t unwitnessed falls near bed x2 today. Family report increase in AMS the last few days. No thinners. In C-collar by EMS. Normally combative but recently not. A&Ox1. Hx dementia

## 2023-11-05 NOTE — H&P (Incomplete)
 History and Physical  Gabrielle Gonzales FMW:[id number] DOB: Nov 25, 1940 DOA: 11/05/2023  Referring physician: Dr. Pamella, EDP  PCP: [facility], [facility]  Outpatient Specialists: None Patient coming from: Home  Chief Complaint: Falls   HPI: Gabrielle Gonzales is a 83 y.o. female with medical history significant for dementia, blindness diagnosed 5 years ago, who presents to the ER due to altered mental status and multiple falls.  At baseline the patient uses a walker, is verbal, incoherent, incontinent of urine and stools, dependent of activities of daily living.  This morning the patient fell.  EMS was activated and when they came her vital signs were stable, they left.  Few hours later she fell again.  Could not get her back up therefore EMS was again activated and she was brought into the ER for further evaluation.  The last 3 to 4 weeks she had had minimal energy and stopped walking.  The patient's son noticed that she was not talking much today as she normally does, and was rolling her eyes.  Last known well was around 4:30 PM.  The patient is currently not on any medications prior to admission.  In the ER, BP was elevated.  No prior history of hypertension.  CT head revealed the following findings: 1. No CT evidence for acute intracranial abnormality. Atrophy and chronic small vessel ischemic changes of the white matter. 2. 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 More focal CSF density over the right temporoparietal region is unchanged and may reflect arachnoid cyst or chronic focal hygroma 3. Straightening of the cervical spine with degenerative changes. No acute osseous abnormality.  MRI brain ordered and is pending.  UA positive for pyuria.  Rocephin  started empirically.  Electrolyte disturbances, repleted along with IV fluid.  ED Course: Temperature 98.7.  BP 170/96, pulse 70, respiratory 16, O2 saturation  99% on room air.  Study notable for serum potassium 3.1, magnesium  1.5.  Review of Systems: Review of systems as noted in the HPI. All other systems reviewed and are negative.   Past Medical History:  Diagnosis Date   Blind    Dementia [facility])    Per family   History reviewed. No pertinent surgical history.  Social History:  reports that she has been smoking cigarettes. She uses smokeless tobacco. She reports current alcohol use. She reports that she does not use drugs.   Allergies  Allergen Reactions   Atorvastatin Other (See Comments)    Headache   Niacin Other (See Comments)    Skin flushed and felt like it was crawling    Family history: None reported.  Prior to Admission medications   Medication Sig Start Date End Date Taking? Authorizing Provider  lisinopril -hydrochlorothiazide  (PRINZIDE ,ZESTORETIC ) 10-12.5 MG tablet Take 1 tablet by mouth daily. Patient not taking: Reported on 07/11/2023 01/17/18   Sagardia, Miguel Jose, MD  polyethylene glycol (MIRALAX  / GLYCOLAX ) 17 g packet Take 17 g by mouth daily as needed for mild constipation. 07/14/23   Cindy Garnette POUR, MD  QUEtiapine  (SEROQUEL ) 25 MG tablet Take 1 tablet (25 mg total) by mouth at bedtime. 07/14/23 08/13/23  Cindy Garnette POUR, MD  simvastatin  (ZOCOR ) 40 MG tablet Take 1 tablet (40 mg total) by mouth daily at 6 PM. Patient not taking: Reported on 07/11/2023 01/06/18 07/11/23  Purcell Emil Schanz, MD    Physical Exam: BP (!) 150/103   Pulse 70   Temp (!) 97.3 F (36.3 C) (  Oral)   Resp 12   LMP  (LMP Unknown)   SpO2 97%   General: 83 y.o. year-old female well developed well nourished in no acute distress.  Alert and altered with underlying dementia. Cardiovascular: Regular rate and rhythm with no rubs or gallops.  No thyromegaly or JVD noted.  No lower extremity edema. 2/4 pulses in all 4 extremities. Respiratory: Clear to auscultation with no wheezes or rales.  Poor inspiratory effort. Abdomen: Soft nontender  nondistended with normal bowel sounds x4 quadrants. Muskuloskeletal: No cyanosis, clubbing or edema noted bilaterally Neuro: CN II-XII intact, strength, sensation, reflexes Skin: No ulcerative lesions noted or rashes Psychiatry: Judgement and insight appear altered. Mood is appropriate for condition and setting          Labs on Admission:  Basic Metabolic Panel: Recent Labs  Lab 11/05/23 1924  NA 140  K 2.8*  CL 103  CO2 24  GLUCOSE 79  BUN 11  CREATININE 0.80  CALCIUM 8.3*   Liver Function Tests: No results for input(s): AST, ALT, ALKPHOS, BILITOT, PROT, ALBUMIN in the last 168 hours. No results for input(s): LIPASE, AMYLASE in the last 168 hours. No results for input(s): AMMONIA in the last 168 hours. CBC: Recent Labs  Lab 11/05/23 1924  WBC 8.3  NEUTROABS 5.4  HGB 11.9*  HCT 36.4  MCV 89.0  PLT 239   Cardiac Enzymes: No results for input(s): CKTOTAL, CKMB, CKMBINDEX, TROPONINI in the last 168 hours.  BNP (last 3 results) No results for input(s): BNP in the last 8760 hours.  ProBNP (last 3 results) No results for input(s): PROBNP in the last 8760 hours.  CBG: No results for input(s): GLUCAP in the last 168 hours.  Radiological Exams on Admission: CT Head Wo Contrast Result Date: 11/05/2023 CLINICAL DATA:  Head and neck trauma EXAM: CT HEAD WITHOUT CONTRAST CT CERVICAL SPINE WITHOUT CONTRAST TECHNIQUE: Multidetector CT imaging of the head and cervical spine was performed following the standard protocol without intravenous contrast. Multiplanar CT image reconstructions of the cervical spine were also generated. RADIATION DOSE REDUCTION: This exam was performed according to the departmental dose-optimization program which includes automated exposure control, adjustment of the mA and/or kV according to patient size and/or use of iterative reconstruction technique. COMPARISON:  CT brain 07/12/2023, CT brain and cervical spine 07/11/2023  FINDINGS: CT HEAD FINDINGS Brain: No acute territorial infarction or hemorrhage is visualized. Focal CSF density over the right temporoparietal region with mass effect, this measures 4.9 cm AP by 2 cm transverse and is grossly stable. 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 The ventricles are mildly prominent but grossly stable in size. There is atrophy and chronic small vessel ischemic changes of the white matter. Suspect small chronic infarcts in the left thalamus. Vascular: No hyperdense vessels.  Carotid vascular calcification. Skull: Normal. Negative for fracture or focal lesion. Sinuses/Orbits: No acute finding. Other: Left posterior partially calcified scalp lesion measures slightly larger at 3.2 cm compared with 2.8 cm previously CT CERVICAL SPINE FINDINGS Alignment: Straightening of the cervical spine. No subluxation. Facet alignment is within normal limits. Skull base and vertebrae: No acute fracture. No primary bone lesion or focal pathologic process. Soft tissues and spinal canal: No prevertebral fluid or swelling. No visible canal hematoma. Disc levels: Moderate disc space narrowing C4-C5, C5-C6. Multilevel facet degenerative changes. Upper chest: Apical emphysema.  Carotid vascular calcification Other: None IMPRESSION: 1. No CT evidence for  acute intracranial abnormality. Atrophy and chronic small vessel ischemic changes of the white matter. 2. 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 More focal CSF density over the right temporoparietal region is unchanged and may reflect arachnoid cyst or chronic focal hygroma 3. Straightening of the cervical spine with degenerative changes. No acute osseous abnormality. Electronically Signed   By: Luke Bun M.D.   On: 11/05/2023 20:35   CT Cervical Spine Wo Contrast Result Date:  11/05/2023 CLINICAL DATA:  Head and neck trauma EXAM: CT HEAD WITHOUT CONTRAST CT CERVICAL SPINE WITHOUT CONTRAST TECHNIQUE: Multidetector CT imaging of the head and cervical spine was performed following the standard protocol without intravenous contrast. Multiplanar CT image reconstructions of the cervical spine were also generated. RADIATION DOSE REDUCTION: This exam was performed according to the departmental dose-optimization program which includes automated exposure control, adjustment of the mA and/or kV according to patient size and/or use of iterative reconstruction technique. COMPARISON:  CT brain 07/12/2023, CT brain and cervical spine 07/11/2023 FINDINGS: CT HEAD FINDINGS Brain: No acute territorial infarction or hemorrhage is visualized. Focal CSF density over the right temporoparietal region with mass effect, this measures 4.9 cm AP by 2 cm transverse and is grossly stable. 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 The ventricles are mildly prominent but grossly stable in size. There is atrophy and chronic small vessel ischemic changes of the white matter. Suspect small chronic infarcts in the left thalamus. Vascular: No hyperdense vessels.  Carotid vascular calcification. Skull: Normal. Negative for fracture or focal lesion. Sinuses/Orbits: No acute finding. Other: Left posterior partially calcified scalp lesion measures slightly larger at 3.2 cm compared with 2.8 cm previously CT CERVICAL SPINE FINDINGS Alignment: Straightening of the cervical spine. No subluxation. Facet alignment is within normal limits. Skull base and vertebrae: No acute fracture. No primary bone lesion or focal pathologic process. Soft tissues and spinal canal: No prevertebral fluid or swelling. No visible canal hematoma. Disc levels: Moderate disc space narrowing C4-C5, C5-C6. Multilevel facet degenerative changes. Upper chest: Apical  emphysema.  Carotid vascular calcification Other: None IMPRESSION: 1. No CT evidence for acute intracranial abnormality. Atrophy and chronic small vessel ischemic changes of the white matter. 2. 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 More focal CSF density over the right temporoparietal region is unchanged and may reflect arachnoid cyst or chronic focal hygroma 3. Straightening of the cervical spine with degenerative changes. No acute osseous abnormality. Electronically Signed   By: Luke Bun M.D.   On: 11/05/2023 20:35   DG Pelvis Portable Result Date: 11/05/2023 CLINICAL DATA:  Fall EXAM: PORTABLE PELVIS 1-2 VIEWS COMPARISON:  None Available. FINDINGS: No acute bony abnormality. Specifically, no fracture, subluxation, or dislocation. Hip joints and SI joints symmetric. Diffuse osteopenia. IMPRESSION: No acute bony abnormality. Electronically Signed   By: Franky Crease M.D.   On: 11/05/2023 19:29   DG Chest Portable 1 View Result Date: 11/05/2023 CLINICAL DATA:  Fall EXAM: PORTABLE CHEST 1 VIEW COMPARISON:  07/11/2023 FINDINGS: Heart and mediastinal contours within normal limits. Aortic atherosclerosis. Bibasilar atelectasis. No effusions. No acute bony abnormality. IMPRESSION: Bibasilar atelectasis. Electronically Signed   By: Franky Crease M.D.   On: 11/05/2023 19:28    EKG: I independently viewed the EKG done and my findings are as followed: Sinus  rhythm rate of 73.  Nonspecific ST-T changes QTc 422.  Assessment/Plan Present on Admission:  AMS (altered mental status)  Principal Problem:   AMS (altered mental status)  Acute metabolic encephalopathy in the setting of dementia Treat underlying conditions Reorient as needed Noncontrast head CT results as stated above. Follow MRI brain Continue fall precautions  Presumed UTI, POA UA positive for pyuria Follow urine culture Continue  Rocephin   Multiple falls At baseline ambulates with a walker PT OT evaluation Fall precautions  Electrolyte disturbances Potassium 3.1, magnesium  1.5 Repleted intravenously Continue to replace electrolytes LR KCl 40 mEq at 75 cc/h x 1 day Repeat levels in the morning  Elevated blood pressure Not on oral antihypertensives prior to admission Still awaiting MRI brain Will treat with small dose of labetalol  for now. Continue to closely monitor vital signs  Dementia Reorient as needed Dependent of activities of daily living Unable to care for herself, TOC consulted to assist. The patient's son, Gabrielle Gonzales [phone], requested to be contacted.  Addendum: MRI completed, chronic right frontal convexity lobulated and multiloculated.  Low-density subdural hematoma, does appear mildly increased since 07/12/2023 although without associated midline shift.  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 Will hold off subcu Lovenox  for now and add SCDs.    Time: 75 minutes   DVT prophylaxis: Subcu Lovenox  daily.  DC'd 11/06/2023, SCDs added.  Restart chemical DVT prophylaxis when appropriate.  Code Status: DNR.  Patient son, Gabrielle Gonzales, who is also her power of attorney.  Family Communication: Updated the patient's son, Mr. Gonzales via phone.  Disposition Plan: Admitted to telemetry medical unit  Consults called: None  Admission status: Observation status   Status is: Observation    Terry LOISE Hurst MD [facility] Pager [phone]  If 7PM-7AM, please contact night-coverage [email or web address] Password [email or web address]  11/05/2023, 9:39 PM

## 2023-11-06 ENCOUNTER — Observation Stay (HOSPITAL_COMMUNITY)

## 2023-11-06 DIAGNOSIS — R4182 Altered mental status, unspecified: Secondary | ICD-10-CM | POA: Diagnosis not present

## 2023-11-06 LAB — CBC
HCT: 34.3 % — ABNORMAL LOW (ref 36.0–46.0)
Hemoglobin: 11.5 g/dL — ABNORMAL LOW (ref 12.0–15.0)
MCH: 29.5 pg (ref 26.0–34.0)
MCHC: 33.5 g/dL (ref 30.0–36.0)
MCV: 87.9 fL (ref 80.0–100.0)
Platelets: 248 K/uL (ref 150–400)
RBC: 3.9 MIL/uL (ref 3.87–5.11)
RDW: 16 % — ABNORMAL HIGH (ref 11.5–15.5)
WBC: 7.1 K/uL (ref 4.0–10.5)
nRBC: 0 % (ref 0.0–0.2)

## 2023-11-06 LAB — BASIC METABOLIC PANEL WITH GFR
Anion gap: 12 (ref 5–15)
BUN: 9 mg/dL (ref 8–23)
CO2: 23 mmol/L (ref 22–32)
Calcium: 7.8 mg/dL — ABNORMAL LOW (ref 8.9–10.3)
Chloride: 104 mmol/L (ref 98–111)
Creatinine, Ser: 0.78 mg/dL (ref 0.44–1.00)
GFR, Estimated: >60 mL/min (ref >60)
Glucose, Bld: 74 mg/dL (ref 70–99)
Potassium: 3.1 mmol/L — ABNORMAL LOW (ref 3.5–5.1)
Sodium: 139 mmol/L (ref 135–145)

## 2023-11-06 LAB — PHOSPHORUS: Phosphorus: 2.5 mg/dL (ref 2.5–4.6)

## 2023-11-06 LAB — MAGNESIUM: Magnesium: 1.5 mg/dL — ABNORMAL LOW (ref 1.7–2.4)

## 2023-11-06 MED ORDER — LABETALOL HCL 5 MG/ML IV SOLN
5.0000 mg | Freq: Once | INTRAVENOUS | Status: AC
  Administered 2023-11-06: 5 mg via INTRAVENOUS
  Filled 2023-11-06: qty 4

## 2023-11-06 MED ORDER — MAGNESIUM SULFATE 2 GM/50ML IV SOLN
2.0000 g | Freq: Once | INTRAVENOUS | Status: AC
  Administered 2023-11-06: 2 g via INTRAVENOUS
  Filled 2023-11-06: qty 50

## 2023-11-06 MED ORDER — POTASSIUM CHLORIDE CRYS ER 20 MEQ PO TBCR
40.0000 meq | EXTENDED_RELEASE_TABLET | ORAL | Status: AC
Start: 2023-11-06 — End: 2023-11-06
  Administered 2023-11-06: 40 meq via ORAL
  Filled 2023-11-06 (×2): qty 2

## 2023-11-06 NOTE — Plan of Care (Signed)
  Problem: Coping: Goal: Level of anxiety will decrease Outcome: Progressing   Problem: Elimination: Goal: Will not experience complications related to urinary retention Outcome: Progressing   Problem: Safety: Goal: Ability to remain free from injury will improve Outcome: Progressing   

## 2023-11-06 NOTE — Progress Notes (Signed)
 PROGRESS NOTE  Gabrielle Gonzales  DOB: [birth year]  PCP: [facility], [address] FMW:[id number]  DOA: 11/05/2023  LOS: 0 days  Hospital Day: 2  Brief narrative: Gabrielle Gonzales is a 83 y.o. female with PMH significant for dementia, blindness 9/20, patient presented to the ED with complaint of altered mental status, multiple falls. At baseline, patient is verbal, incontinent, incontinent of urine and stools, dependent on activities of daily living.  Patient needs to be able to to ambulate around with a walker but has progressively declined in the last 3 to 4 weeks.  She has had multiple falls. she had 2 falls on 9/20, could not get up after the second call and hence brought to the ED by EMS.  In the ED, afebrile, hemodynamically stable, breathing on room air. WC count 8.3, hemoglobin 11.9, potassium low at 2.8 Urinalysis showed hazy amber-colored urine with moderate leukocytes, positive nitrate, many bacteria Urine culture sent. Started on IV Rocephin  Admitted to [facility]  Subjective: Patient was seen and examined this morning. Pleasant elderly Caucasian female.  Lying on bed.  Oriented to place only.  Family not at bedside. Chart reviewed.  Afebrile, heart rate in 70s, blood pressure overnight in 160s, remains in 120s this morning. Labs from this morning with potassium 3.1, magnesium  1.5  Assessment and plan: UTI Presented with progressively declining status Urinalysis positive for infection I am not sure if urine culture was sent yesterday.  I have reminded RN to check with lab when had to the previous sample or send a new sample Continue IV Rocephin  WBC count normal. Recent Labs  Lab 11/05/23 1924 11/06/23 0339  WBC 8.3 7.1   Acute metabolic encephalopathy  Dementia Secondary to UTI, physical decline.  Has underlying dementia CT head and MRI brain without acute findings. At baseline, patient is verbal, incontinent, incontinent of urine and stools, dependent on activities of daily  living.  Continue delirium precautions  Hypokalemia/hypomagnesemia Low potassium and magnesium  level as below. Replacements ordered. Recent Labs  Lab 11/05/23 1924 11/06/23 0339  K 2.8* 3.1*  MG  --  1.5*  PHOS  --  2.5   Multiple falls At baseline he is to be ambulating with a walker.  2-week follow-up 3 to 4 weeks PT OT evaluation pending Fall precautions    Elevated blood pressure Not on oral antihypertensives prior to admission Blood pressure overnight was elevated to 150s.  Normal this morning.  Continue to monitor.   Mobility: PT eval requested PT Orders:   PT Follow up Rec:    Goals of care   Code Status: Limited: Do not attempt resuscitation (DNR) -DNR-LIMITED -Do Not Intubate/DNI      DVT prophylaxis:  Place and maintain sequential compression device Start: 11/06/23 0617   Antimicrobials: IV Rocephin  Fluid: LR with K at 75 mL/h Consultants: None Family Communication: None at bedside  Status: Observation Level of care:  Telemetry Medical   Patient is from: Home Needs to continue in-hospital care: Needs IV antibiotics Anticipated d/c to: Pending PT eval, pending hospital course    Diet:  Diet Order             Diet regular Room service appropriate? Yes; Fluid consistency: Thin  Diet effective now                   Scheduled Meds:    PRN meds: acetaminophen , melatonin, polyethylene glycol, prochlorperazine    Infusions:   cefTRIAXone  (ROCEPHIN )  IV     lactated ringers  1,000  mL with potassium chloride  40 mEq infusion 75 mL/hr at 11/05/23 2216    Antimicrobials: Anti-infectives (From admission, onward)    Start     Dose/Rate Route Frequency Ordered Stop   11/06/23 2200  cefTRIAXone  (ROCEPHIN ) 1 g in sodium chloride  0.9 % 100 mL IVPB        1 g 200 mL/hr over 30 Minutes Intravenous Every 24 hours 11/05/23 2113     11/05/23 2045  cefTRIAXone  (ROCEPHIN ) 1 g in sodium chloride  0.9 % 100 mL IVPB        1 g 200 mL/hr over 30 Minutes  Intravenous  Once 11/05/23 2043 11/05/23 2146       Objective: Vitals:   11/06/23 0643 11/06/23 0830  BP: 129/89 (!) 137/91  Pulse:  60  Resp:  16  Temp:  98.1 F (36.7 C)  SpO2:  98%    Intake/Output Summary (Last 24 hours) at 11/06/2023 1058 Last data filed at 11/06/2023 9375 Gross per 24 hour  Intake 1093.04 ml  Output --  Net 1093.04 ml   There were no vitals filed for this visit. Weight change:  There is no height or weight on file to calculate BMI.   Physical Exam: General exam: Pleasant, elderly Caucasian female.  Not in distress currently Skin: No rashes, lesions or ulcers. HEENT: Atraumatic, normocephalic, no obvious bleeding Lungs: Clear to auscultation bilaterally,  CVS: S1, S2, no murmur,   GI/Abd: Soft, nontender, nondistended, bowel sound present,   CNS: Alert, awake, oriented to place only Psychiatry: Mood appropriate Extremities: No pedal edema, no calf tenderness,   Data Review: I have personally reviewed the laboratory data and studies available.  F/u labs ordered Unresulted Labs (From admission, onward)     Start     Ordered   11/07/23 0500  Basic metabolic panel  Tomorrow morning,   R        11/06/23 0605   11/07/23 0500  Magnesium   Tomorrow morning,   R        11/06/23 0605   11/05/23 1947  Urine Culture  Once,   R        11/05/23 1947            Signed, Chapman Rota, MD [facility] 11/06/2023

## 2023-11-06 NOTE — Evaluation (Signed)
 Physical Therapy Evaluation Patient Details Name: Gabrielle Gonzales MRN: [id number] DOB: [birth year] Today's Date: 11/06/2023  History of Present Illness  83 yo female presents to [facility] 9/20 s/p falls, AMS, UTI. 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 PMH includes dementia, blindness.  Clinical Impression   Pt presents with debility, impaired balance with history of falls, impaired activity tolerance, and generalized aches suspect due to falls. Pt to benefit from acute PT to address deficits. Pt requiring moderate physical assist to perform bed mobility tasks, once EOB pt able to sit EOB with supervision. Pt declines stand attempts repeatedly, states I don't feel like it. Unsure of pt's true baseline, no family at bedside. May need to consider placement, vs home with family if able to provide significant physical and ADL assist. PT to progress mobility as tolerated, and will continue to follow acutely.          If plan is discharge home, recommend the following: A lot of help with walking and/or transfers;A lot of help with bathing/dressing/bathroom   Can travel by private vehicle        Equipment Recommendations Wheelchair cushion (measurements PT);Wheelchair (measurements PT)  Recommendations for Other Services       Functional Status Assessment Patient has had a recent decline in their functional status and/or demonstrates limited ability to make significant improvements in function in a reasonable and predictable amount of time     Precautions / Restrictions Precautions Precautions: Fall Restrictions Weight Bearing Restrictions Per Provider Order: No      Mobility  Bed Mobility Overal bed mobility: Needs Assistance Bed Mobility: Supine to Sit, Sit to Supine     Supine to sit: Mod assist Sit to supine: Mod  assist   General bed mobility comments: assist for trunk and LE management, boost up in bed. anticipate required increased assist due to lack of effort    Transfers                   General transfer comment: pt declines repeatedly    Ambulation/Gait                  Stairs            Wheelchair Mobility     Tilt Bed    Modified Rankin (Stroke Patients Only)       Balance Overall balance assessment: Needs assistance, History of Falls Sitting-balance support: No upper extremity supported, Feet supported Sitting balance-Leahy Scale: Fair         Standing balance comment: nt - pt refusal                             Pertinent Vitals/Pain Pain Assessment Pain Assessment: Faces Faces Pain Scale: Hurts little more Pain Location: generalized, from falls Pain Descriptors / Indicators: Discomfort, Grimacing, Guarding Pain Intervention(s): Limited activity within patient's tolerance, Monitored during session, Repositioned    Home Living Family/patient expects to be discharged to:: Private residence Living Arrangements: Children Available Help at Discharge: Available 24 hours/day Type of Home: House Home Access: Stairs to enter Entrance Stairs-Rails: Right;Left;Can reach both Entrance Stairs-Number of Steps: 4   Home Layout: One level Home Equipment: Hand held shower head;Shower Counsellor (2 wheels);Cane - single point      Prior Function Prior Level of Function : Needs assist;History  of Falls (last six months)             Mobility Comments: pt states she walks sometimes, and uses RW sometimes. Limited responses to PT questions, most information above obtained from chart review. ADLs Comments: unsure, per chart review pt is dependent for ADLs     Extremity/Trunk Assessment   Upper Extremity Assessment Upper Extremity Assessment: Defer to OT evaluation    Lower Extremity Assessment Lower Extremity Assessment:  Generalized weakness    Cervical / Trunk Assessment Cervical / Trunk Assessment: Kyphotic  Communication   Communication Communication: No apparent difficulties Factors Affecting Communication: Hearing impaired;Other (comment) (blindness in chart, per RN pt's son states pt can see shadows)    Cognition Arousal: Lethargic Behavior During Therapy: Flat affect   PT - Cognitive impairments: History of cognitive impairments                       PT - Cognition Comments: history of dementia, pt is oriented to self and location but not circumstance or time. Pt with limited verbalizations to PT, no family at bedside to determine if this is baseline Following commands: Impaired Following commands impaired: Follows one step commands inconsistently, Follows one step commands with increased time     Cueing       General Comments      Exercises     Assessment/Plan    PT Assessment Patient needs continued PT services  PT Problem List Decreased strength;Decreased mobility;Decreased activity tolerance;Decreased balance;Decreased knowledge of use of DME;Pain;Decreased cognition;Decreased safety awareness       PT Treatment Interventions DME instruction;Therapeutic activities;Gait training;Therapeutic exercise;Stair training;Balance training;Patient/family education;Functional mobility training;Neuromuscular re-education    PT Goals (Current goals can be found in the Care Plan section)  Acute Rehab PT Goals PT Goal Formulation: Patient unable to participate in goal setting Time For Goal Achievement: 11/20/23 Potential to Achieve Goals: Fair    Frequency Min 2X/week     Co-evaluation               AM-PAC PT 6 Clicks Mobility  Outcome Measure Help needed turning from your back to your side while in a flat bed without using bedrails?: A Lot Help needed moving from lying on your back to sitting on the side of a flat bed without using bedrails?: A Lot Help needed  moving to and from a bed to a chair (including a wheelchair)?: A Lot Help needed standing up from a chair using your arms (e.g., wheelchair or bedside chair)?: Total Help needed to walk in hospital room?: Total Help needed climbing 3-5 steps with a railing? : Total 6 Click Score: 9    End of Session   Activity Tolerance: Patient limited by fatigue;Patient limited by pain Patient left: in bed;with bed alarm set;with call bell/phone within reach Nurse Communication: Mobility status PT Visit Diagnosis: Other abnormalities of gait and mobility (R26.89);Muscle weakness (generalized) (M62.81)    Time: 8484-8468 PT Time Calculation (min) (ACUTE ONLY): 16 min   Charges:   PT Evaluation $PT Eval Low Complexity: 1 Low   PT General Charges $$ ACUTE PT VISIT: 1 Visit         Gabrielle Gonzales, PT DPT Acute Rehabilitation Services Secure Chat Preferred  Office [phone]   Gabrielle Gonzales [place] 11/06/2023, 3:55 PM

## 2023-11-07 DIAGNOSIS — R4182 Altered mental status, unspecified: Secondary | ICD-10-CM | POA: Diagnosis not present

## 2023-11-07 LAB — MAGNESIUM: Magnesium: 1.8 mg/dL (ref 1.7–2.4)

## 2023-11-07 LAB — BASIC METABOLIC PANEL WITH GFR
Anion gap: 10 (ref 5–15)
BUN: 9 mg/dL (ref 8–23)
CO2: 20 mmol/L — ABNORMAL LOW (ref 22–32)
Calcium: 7.6 mg/dL — ABNORMAL LOW (ref 8.9–10.3)
Chloride: 105 mmol/L (ref 98–111)
Creatinine, Ser: 0.78 mg/dL (ref 0.44–1.00)
GFR, Estimated: >60 mL/min (ref >60)
Glucose, Bld: 83 mg/dL (ref 70–99)
Potassium: 4.4 mmol/L (ref 3.5–5.1)
Sodium: 135 mmol/L (ref 135–145)

## 2023-11-07 LAB — URINE CULTURE: Culture: >=100000 — AB

## 2023-11-07 MED ORDER — CEFADROXIL 500 MG PO CAPS
1000.0000 mg | ORAL_CAPSULE | Freq: Two times a day (BID) | ORAL | Status: DC
Start: 2023-11-07 — End: 2023-11-08
  Administered 2023-11-07 – 2023-11-08 (×2): 1000 mg via ORAL
  Filled 2023-11-07 (×3): qty 2

## 2023-11-07 NOTE — Progress Notes (Signed)
 PROGRESS NOTE  Lamesha Tibbits Lawley  DOB: [birth year]  PCP: [facility] [address] FMW:[id number]  DOA: 11/05/2023  LOS: 0 days  Hospital Day: 3  Brief narrative: Gabrielle Gonzales is a 83 y.o. female with PMH significant for dementia, blindness 9/20, patient presented to the ED with complaint of altered mental status, multiple falls. At baseline, patient is verbal, incontinent, incontinent of urine and stools, dependent on activities of daily living.  Patient needs to be able to to ambulate around with a walker but has progressively declined in the last 3 to 4 weeks.  She has had multiple falls. she had 2 falls on 9/20, could not get up after the second call and hence brought to the ED by EMS.  In the ED, afebrile, hemodynamically stable, breathing on room air. WC count 8.3, hemoglobin 11.9, potassium low at 2.8 Urinalysis showed hazy amber-colored urine with moderate leukocytes, positive nitrate, many bacteria Urine culture sent. Started on IV Rocephin  Admitted to [facility]  Subjective: Patient was seen and examined this morning. Propped up in bed.  Alert, awake, oriented to place only.   Family not at bedside.   Seen by PT.  SNF recommended. Blood pressure in 140s and 150s. BMP this morning unremarkable Urine culture sent on admission is growing E. coli, pending sensitivity  Assessment and plan: E. coli UTI Presented with progressively declining status Urinalysis positive for infection Urine culture sent on admission is growing E. coli, pending sensitivity Currently remains on IV Rocephin  WBC count normal.  No fever. Continue to monitor Recent Labs  Lab 11/05/23 1924 11/06/23 0339  WBC 8.3 7.1   Acute metabolic encephalopathy  Dementia Secondary to UTI, physical decline.  Has underlying dementia CT head and MRI brain without acute findings. At baseline, patient is verbal, incontinent, incontinent of urine and stools, dependent on activities of daily living.  Continue delirium  precautions  Hypokalemia/hypomagnesemia Low potassium and magnesium  level as below. Replacements ordered. Recent Labs  Lab 11/05/23 1924 11/06/23 0339 11/07/23 0547  K 2.8* 3.1* 4.4  MG  --  1.5* 1.8  PHOS  --  2.5  --    Multiple falls At baseline he is to be ambulating with a walker.  2-week follow-up 3 to 4 weeks PT OT evaluation obtained.  SNF recommended Fall precautions    Elevated blood pressure Not on oral antihypertensives prior to admission Blood pressure overnight was elevated to 150s.  Normal this morning.  Continue to monitor.   Mobility:  PT Orders:   PT Follow up Rec: Skilled Nursing-Short Term Rehab (<3 Hours/Day) (Vs Home If Family Can Provide 24/7 Support And Physical Assist)11/06/2023 1554   Goals of care   Code Status: Limited: Do not attempt resuscitation (DNR) -DNR-LIMITED -Do Not Intubate/DNI      DVT prophylaxis:  Place and maintain sequential compression device Start: 11/06/23 0617   Antimicrobials: IV Rocephin  Fluid: Not on IV fluid Consultants: None Family Communication: None at bedside.  Try to contact son this morning but unable to reach.  Voicemail full.  Status: Observation Level of care: Currently on telemetry medical.  Downgrade to Med-Surg.  Patient is from: Home Needs to continue in-hospital care: Needs IV antibiotics, pending sensitivity report Anticipated d/c to: pending hospital course    Diet:  Diet Order             Diet regular Room service appropriate? Yes; Fluid consistency: Thin  Diet effective now  Scheduled Meds:    PRN meds: acetaminophen , melatonin, polyethylene glycol, prochlorperazine    Infusions:   cefTRIAXone  (ROCEPHIN )  IV 1 g (11/06/23 2115)    Antimicrobials: Anti-infectives (From admission, onward)    Start     Dose/Rate Route Frequency Ordered Stop   11/06/23 2200  cefTRIAXone  (ROCEPHIN ) 1 g in sodium chloride  0.9 % 100 mL IVPB        1 g 200 mL/hr over 30 Minutes  Intravenous Every 24 hours 11/05/23 2113     11/05/23 2045  cefTRIAXone  (ROCEPHIN ) 1 g in sodium chloride  0.9 % 100 mL IVPB        1 g 200 mL/hr over 30 Minutes Intravenous  Once 11/05/23 2043 11/05/23 2146       Objective: Vitals:   11/07/23 0400 11/07/23 0725  BP: (!) 140/99 (!) 154/81  Pulse: 64 63  Resp: 16 16  Temp: 98.6 F (37 C) 98.2 F (36.8 C)  SpO2: 100% 97%    Intake/Output Summary (Last 24 hours) at 11/07/2023 1113 Last data filed at 11/06/2023 2300 Gross per 24 hour  Intake 358 ml  Output 750 ml  Net -392 ml   There were no vitals filed for this visit. Weight change:  There is no height or weight on file to calculate BMI.   Physical Exam: General exam: Pleasant, elderly Caucasian female.  Not in distress currently Skin: No rashes, lesions or ulcers. HEENT: Atraumatic, normocephalic, no obvious bleeding Lungs: Clear to auscultation bilaterally,  CVS: S1, S2, no murmur,   GI/Abd: Soft, nontender, nondistended, bowel sound present,   CNS: Alert, awake, oriented to place only Psychiatry: Mood appropriate Extremities: No pedal edema, no calf tenderness,   Data Review: I have personally reviewed the laboratory data and studies available.  F/u labs ordered Unresulted Labs (From admission, onward)    None       Signed, Chapman Rota, MD [facility] 11/07/2023

## 2023-11-07 NOTE — TOC Initial Note (Addendum)
 Transition of Care [facility]) - Initial/Assessment Note    Patient Details  Name: Gabrielle Gonzales MRN: [id number] Date of Birth: [birth year]  [facility] of [facility]) CM/SW Contact:    Gabrielle GORMAN Kindle, LCSW Phone Number: 11/07/2023, 10:23 AM  Clinical Narrative:                 10:23am-CSW contacted patient's son to discuss SNF recommendation. Voicemail is full. CSW will continue attempts.   11am-[name] returned call and stated patient lives with him but she has been incontinent and he and his brother work and cannot help her. He is agreeable to SNF referrals. CSW briefly discussed potential need for long term placement with patient's Medicaid as well. Will provide bed offers as available and pursue insurance approval. Requested OT evaluation from [facility].   3:50 PM-CSW confirmed with patient's bed offers that they can keep for LTC if needed.   CSW spoke with patient's son and provided SNF bed offers. He will review and call CSW back.   5:13 PM-CSW received return call from patient's son. He is requesting [facility]. CSW submitted insurance process and received approval, Ref# [id number], effective 11/08/2023-11/10/2023. CSW awaiting confirmation of bed availability from [facility] if patient is stable tomorrow.     Barriers to Discharge: English as a second language teacher, SNF Pending bed offer   Patient Goals and CMS Choice   CMS Medicare.gov Compare Post Acute Care list provided to:: Patient Represenative (must comment) Choice offered to / list presented to : Adult Children [place] ownership interest in [facility].provided to:: Adult Children    Expected Discharge Plan and Services In-house Referral: Clinical Social Work     Living arrangements for the past 2 months: Single Family Home                                      Prior Living Arrangements/Services Living arrangements for the past 2 months: Single Family Home   Patient language and need for interpreter  reviewed:: Yes Do you feel safe going back to the place where you live?: Yes      Need for Family Participation in Patient Care: Yes (Comment) Care giver support system in place?: Yes (comment)   Criminal Activity/Legal Involvement Pertinent to Current Situation/Hospitalization: No - Comment as needed  Activities of Daily Living      Permission Sought/Granted Permission sought to share information with : Facility Medical sales representative, Family Supports Permission granted to share information with : No              Emotional Assessment Appearance:: Appears stated age Attitude/Demeanor/Rapport: Unable to Assess Affect (typically observed): Unable to Assess Orientation: : Oriented to Self, Oriented to Place Alcohol / Substance Use: Not Applicable Psych Involvement: No (comment)  Admission diagnosis:  Fall, initial encounter [W19.XXXA] Urinary tract infection without hematuria, site unspecified [N39.0] Altered mental status, unspecified altered mental status type [R41.82] AMS (altered mental status) [R41.82] Patient Active Problem List   Diagnosis Date Noted   AMS (altered mental status) 11/05/2023   UTI (urinary tract infection) 07/11/2023   Pressure injury of skin 07/11/2023   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 [place] [address] Phone: [phone] Fax: [phone]  [facility] [address] [place] [address] Phone: [phone] Fax: [phone]  Social Drivers of Health (SDOH) Social History: SDOH Screenings   Food Insecurity: Patient Unable To Answer (11/06/2023)  Housing: Patient Unable To Answer (11/06/2023)  Transportation Needs: Patient Unable To Answer (11/06/2023)  Utilities: Patient Unable To Answer (11/06/2023)  Depression (PHQ2-9): Low Risk  (12/12/2018)  Social Connections: Patient Unable To Answer  (11/06/2023)  Tobacco Use: High Risk (11/05/2023)   SDOH Interventions:     Readmission Risk Interventions     No data to display

## 2023-11-07 NOTE — Evaluation (Signed)
 Occupational Therapy Evaluation Patient Details Name: NIAYA HICKOK MRN: [id number] DOB: 12-10-1940 Today's Date: 11/07/2023   History of Present Illness   83 yo female presents to [facility] 9/20 s/p falls, AMS, UTI. 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 PMH includes dementia, blindness.     Clinical Impressions Prior to this admission, patient living at home, however could not state who lived with her or what she was doing prior to this admission. All home information obtained from the chart. Currently, patient presenting with flat affect, generalized weakness, confusion, and need for mod A for ADL management. Patient soiled in bed, and able to transfer to [facility] with min A. Patient unaware of BM on floor. Patient requring max to total A for peri-care and bathing. Patient transferred back to bed at end of session due to decreased awareness and confusion. OT recommending lesser intensive setting for rehab < 3 hours prior to discharge home. OT will continue to follow acutely.      If plan is discharge home, recommend the following:   A lot of help with walking and/or transfers;A lot of help with bathing/dressing/bathroom;Assistance with feeding;Direct supervision/assist for financial management;Assist for transportation;Help with stairs or ramp for entrance;Supervision due to cognitive status;Direct supervision/assist for medications management     Functional Status Assessment   Patient has had a recent decline in their functional status and demonstrates the ability to make significant improvements in function in a reasonable and predictable amount of time.     Equipment Recommendations   Other (comment) (will continue to assess)     Recommendations for Other Services         Precautions/Restrictions    Precautions Precautions: Fall Recall of Precautions/Restrictions: Impaired Restrictions Weight Bearing Restrictions Per Provider Order: No     Mobility Bed Mobility Overal bed mobility: Needs Assistance Bed Mobility: Supine to Sit, Sit to Supine     Supine to sit: Min assist Sit to supine: Min assist   General bed mobility comments: min A for safety, increased cues required    Transfers Overall transfer level: Needs assistance Equipment used: 2 person hand held assist Transfers: Sit to/from Stand, Bed to chair/wheelchair/BSC Sit to Stand: Min assist Stand pivot transfers: Min assist         General transfer comment: Min A for tranfers, with one person HHA with no RW present, posterior lean noted and decreased balance throughout      Balance Overall balance assessment: Needs assistance, History of Falls Sitting-balance support: No upper extremity supported, Feet supported Sitting balance-Leahy Scale: Fair   Postural control: Posterior lean Standing balance support: During functional activity, Reliant on assistive device for balance, Bilateral upper extremity supported Standing balance-Leahy Scale: Poor Standing balance comment: reliant on OT for balance                           ADL either performed or assessed with clinical judgement   ADL Overall ADL's : Needs assistance/impaired Eating/Feeding: Sitting;Minimal assistance   Grooming: Sitting;Minimal assistance   Upper Body Bathing: Minimal assistance;Sitting   Lower Body Bathing: Total assistance;Sitting/lateral leans;Sit to/from stand   Upper Body Dressing : Maximal assistance;Sitting   Lower Body Dressing: Total assistance;Sitting/lateral leans;Sit to/from stand   Toilet Transfer: Minimal assistance;Stand-pivot;BSC/3in1   Toileting- Clothing Manipulation and Hygiene: Total assistance;Sitting/lateral lean;Sit to/from stand Toileting - Architect Details (  indicate cue type and  reason): incontinent of bowel and bladder     Functional mobility during ADLs: Rolling walker (2 wheels);Moderate assistance General ADL Comments: Prior to this admission, patient living at home, however could not state who lived with her or what she was doing prior to this admission. All home information obtained from the chart. Currently, patient presenting with flat affect, generalized weakness, confusion, and need for mod A for ADL management. Patient soiled in bed, and able to transfer to [facility] with min A. Patient unaware of BM on floor. Patient requring max to total A for peri-care and bathing. Patient transferred back to bed at end of session due to decreased awareness and confusion. OT recommending lesser intensive setting for rehab < 3 hours prior to discharge home. OT will continue to follow acutely.     Vision Baseline Vision/History: 2 Legally blind Ability to See in Adequate Light: 4 Severely impaired Patient Visual Report: No change from baseline Vision Assessment?: No apparent visual deficits     Perception Perception: Not tested       Praxis Praxis: Not tested       Pertinent Vitals/Pain Pain Assessment Pain Assessment: Faces Faces Pain Scale: No hurt Pain Intervention(s): Limited activity within patient's tolerance, Monitored during session, Repositioned     Extremity/Trunk Assessment Upper Extremity Assessment Upper Extremity Assessment: Generalized weakness   Lower Extremity Assessment Lower Extremity Assessment: Defer to PT evaluation   Cervical / Trunk Assessment Cervical / Trunk Assessment: Kyphotic   Communication Communication Communication: No apparent difficulties Factors Affecting Communication: Hearing impaired;Other (comment) (blindness in chart, per RN pt's son states pt can see shadows)   Cognition Arousal: Alert Behavior During Therapy: Flat affect Cognition: History of cognitive impairments, No family/caregiver present to determine baseline,  Cognition impaired   Orientation impairments: Time, Situation Awareness: Intellectual awareness impaired, Online awareness impaired Memory impairment (select all impairments): Short-term memory, Working memory, Non-declarative long-term memory, Geneticist, molecular long-term memory Attention impairment (select first level of impairment): Focused attention Executive functioning impairment (select all impairments): Initiation, Sequencing, Organization, Problem solving, Reasoning OT - Cognition Comments: Disoriented but more alert, delayed responses to all questioning                 Following commands: Impaired Following commands impaired: Follows one step commands inconsistently, Follows one step commands with increased time     Cueing  General Comments   Cueing Techniques: Verbal cues;Gestural cues;Tactile cues  VSS, wound on buttocks with RN and NT present   Exercises     Shoulder Instructions      Home Living Family/patient expects to be discharged to:: Private residence Living Arrangements: Children Available Help at Discharge: Available 24 hours/day Type of Home: House Home Access: Stairs to enter [employer] of Steps: 4 Entrance Stairs-Rails: Right;Left;Can reach both Home Layout: One level     Bathroom Shower/Tub: Chief Strategy Officer: Standard Bathroom Accessibility: Yes   Home Equipment: Hand held shower head;Shower Counsellor (2 wheels);Cane - single point          Prior Functioning/Environment Prior Level of Function : Needs assist;History of Falls (last six months)             Mobility Comments: Could not state prior history with OT ADLs Comments: unsure, per chart review pt is dependent for ADLs    OT Problem List: Decreased strength;Decreased activity tolerance;Impaired balance (sitting and/or standing);Decreased coordination;Decreased cognition;Decreased safety awareness;Decreased knowledge of precautions;Decreased  knowledge of use of DME or AE  OT Treatment/Interventions: Self-care/ADL training;Therapeutic exercise;DME and/or AE instruction;Energy conservation;Manual therapy;Therapeutic activities;Patient/family education;Balance training      OT Goals(Current goals can be found in the care plan section)   Acute Rehab OT Goals Patient Stated Goal: unable OT Goal Formulation: Patient unable to participate in goal setting Time For Goal Achievement: 11/21/23 Potential to Achieve Goals: Fair ADL Goals Pt Will Perform Lower Body Bathing: with contact guard assist;sitting/lateral leans;sit to/from stand Pt Will Perform Lower Body Dressing: with contact guard assist;sitting/lateral leans;sit to/from stand Pt Will Transfer to Toilet: with contact guard assist;ambulating;regular height toilet Pt Will Perform Toileting - Clothing Manipulation and hygiene: with contact guard assist;sitting/lateral leans;sit to/from stand Additional ADL Goal #1: Patient will demonstrate increased cognitive awareness to follow 1-2 step commands consistently.   OT Frequency:  Min 2X/week    Co-evaluation              AM-PAC OT 6 Clicks Daily Activity     Outcome Measure Help from another person eating meals?: A Little Help from another person taking care of personal grooming?: A Little Help from another person toileting, which includes using toliet, bedpan, or urinal?: A Lot Help from another person bathing (including washing, rinsing, drying)?: A Lot Help from another person to put on and taking off regular upper body clothing?: A Lot Help from another person to put on and taking off regular lower body clothing?: Total 6 Click Score: 13   End of Session Nurse Communication: Mobility status;Other (comment) (wound)  Activity Tolerance: Patient limited by fatigue Patient left: in bed;with call bell/phone within reach;with nursing/sitter in room  OT Visit Diagnosis: Unsteadiness on feet (R26.81);Other  abnormalities of gait and mobility (R26.89);Repeated falls (R29.6);Muscle weakness (generalized) (M62.81);History of falling (Z91.81);Other symptoms and signs involving cognitive function;Adult, failure to thrive (R62.7)                Time: 8944-8883 OT Time Calculation (min): 21 min Charges:  OT General Charges $OT Visit: 1 Visit OT Evaluation $OT Eval Moderate Complexity: 1 Mod  Ronal Gift E. Brisia Schuermann, OTR/L Acute Rehabilitation Services [phone]   Ronal Gift Salt 11/07/2023, 1:10 PM

## 2023-11-07 NOTE — Care Management Obs Status (Signed)
 MEDICARE OBSERVATION STATUS NOTIFICATION   Patient Details  Name: Gabrielle Gonzales MRN: [id number] Date of Birth: 1940-07-24   Medicare Observation Status Notification Given:  Yes    Jon Cruel 11/07/2023, 9:04 AM

## 2023-11-07 NOTE — Plan of Care (Signed)
  Problem: Nutrition: Goal: Adequate nutrition will be maintained Outcome: Progressing   Problem: Elimination: Goal: Will not experience complications related to bowel motility Outcome: Progressing Goal: Will not experience complications related to urinary retention Outcome: Progressing   Problem: Safety: Goal: Ability to remain free from injury will improve Outcome: Progressing   

## 2023-11-07 NOTE — NC FL2 (Signed)
  [place]  MEDICAID FL2 LEVEL OF CARE FORM     IDENTIFICATION  Patient Name: Gabrielle Gonzales Birthdate: [birth year] Sex: female Admission Date (Current Location): 11/05/2023  [facility] and [place] Number:  Producer, television/film/video and Address:  [facility], 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    Orientation RESPIRATION BLADDER Height & Weight     Self    Incontinent, External catheter Weight:   Height:     BEHAVIORAL SYMPTOMS/MOOD NEUROLOGICAL BOWEL NUTRITION STATUS      Continent Diet (See dc summary)  AMBULATORY STATUS COMMUNICATION OF NEEDS Skin   Extensive Assist Verbally PU Stage and Appropriate Care, Other (Comment) (skin tear on labia; stage II on sacrum)                       Personal Care Assistance Level of Assistance  Bathing, Feeding, Dressing Bathing Assistance: Maximum assistance Feeding assistance: Limited assistance Dressing Assistance: Limited assistance     Functional Limitations Info  Sight (blind) Sight Info: Impaired        SPECIAL CARE FACTORS FREQUENCY  PT (By licensed PT), OT (By licensed OT)     PT Frequency: 5x/week OT Frequency: 5x/week            Contractures Contractures Info: Not present    Additional Factors Info  Code Status, Allergies Code Status Info: DNR Allergies Info: Atorvastatin, Niacin           Current Medications (11/07/2023):  This is  the current hospital active medication list Current Facility-Administered Medications  Medication Dose Route Frequency Provider Last Rate Last Admin   acetaminophen  (TYLENOL ) tablet 650 mg  650 mg Oral Q6H PRN Hall, Carole N, DO       cefTRIAXone  (ROCEPHIN ) 1 g in sodium chloride  0.9 % 100 mL IVPB  1 g Intravenous Q24H Shona Laurence N, DO 200 mL/hr at 11/06/23 2115 1 g at 11/06/23 2115   melatonin tablet 5 mg  5 mg Oral QHS PRN Shona Laurence N, DO       polyethylene glycol (MIRALAX  / GLYCOLAX ) packet 17 g  17 g Oral Daily PRN Shona Laurence N, DO       prochlorperazine  (COMPAZINE ) injection 5 mg  5 mg Intravenous Q6H PRN Hall, Carole N, DO         Discharge Medications: Please see discharge summary for a list of discharge medications.  Relevant Imaging Results:  Relevant Lab Results:   Additional Information SSN [id number]  Inocente GORMAN Kindle, LCSW

## 2023-11-07 NOTE — Progress Notes (Signed)
 9/22 I spoke to patient's son Cheria Sadiq telephonically at [phone] and received verbal consent, Patient has Altered Mental Status and is unable to acknowledge.

## 2023-11-08 DIAGNOSIS — R4182 Altered mental status, unspecified: Secondary | ICD-10-CM | POA: Diagnosis not present

## 2023-11-08 MED ORDER — ACETAMINOPHEN 325 MG PO TABS: 650.0000 mg | ORAL_TABLET | Freq: Four times a day (QID) | ORAL | Status: AC | PRN

## 2023-11-08 MED ORDER — POLYETHYLENE GLYCOL 3350 17 G PO PACK: 17.0000 g | PACK | Freq: Every day | ORAL | Status: AC | PRN

## 2023-11-08 MED ORDER — CEFADROXIL 500 MG PO CAPS: 1000.0000 mg | ORAL_CAPSULE | Freq: Two times a day (BID) | ORAL | Status: AC

## 2023-11-08 MED ORDER — MELATONIN 5 MG PO TABS: 5.0000 mg | ORAL_TABLET | Freq: Every evening | ORAL | Status: AC | PRN

## 2023-11-08 NOTE — TOC Transition Note (Signed)
 Transition of Care [facility]) - Discharge Note   Patient Details  Name: Gabrielle Gonzales MRN: [id number] Date of Birth: 11-Jun-1940  [facility] CM/SW Contact:  Inocente GORMAN Kindle, LCSW Phone Number: 11/08/2023, 11:46 AM   Clinical Narrative:    Patient will DC to: [facility] Anticipated DC date: 11/08/23 Family notified: Son, Devaughn Transport by: [place]   Per MD patient ready for DC to [facility]. RN to call report prior to discharge [phone] option [phone], room 208). RN, patient, patient's family, and facility notified of DC. Discharge Summary and FL2 sent to facility. DC packet on chart including signed DNR. Ambulance transport requested for patient.   CSW will sign off for now as social work intervention is no longer needed. Please consult us  again if new needs arise.     Final next level of care: Skilled Nursing Facility Barriers to Discharge: Barriers Resolved   Patient Goals and CMS Choice Patient states their goals for this hospitalization and ongoing recovery are:: Rehab CMS Medicare.gov Compare Post Acute Care list provided to:: Patient Represenative (must comment) Choice offered to / list presented to : Adult Children  ownership interest in [facility].provided to:: Adult Children    Discharge Placement   Existing PASRR number confirmed : 11/08/23          Patient chooses bed at: [facility] Patient to be transferred to facility by: [facility] Name of family member notified: Son Patient and family notified of of transfer: 11/08/23  Discharge Plan and Services Additional resources added to the After Visit Summary for   In-house Referral: Clinical Social Work                                   Social Drivers of Health (SDOH) Interventions SDOH Screenings   Food Insecurity: Patient Unable To Answer (11/06/2023)  Housing: Patient Unable To Answer (11/06/2023)  Transportation Needs: Patient Unable To Answer (11/06/2023)   Utilities: Patient Unable To Answer (11/06/2023)  Depression (PHQ2-9): Low Risk  (12/12/2018)  Social Connections: Patient Unable To Answer (11/06/2023)  Tobacco Use: High Risk (11/05/2023)     Readmission Risk Interventions     No data to display

## 2023-11-08 NOTE — Discharge Summary (Signed)
 Physician Discharge Summary  Gabrielle Gonzales FMW:[id number] DOB: January 02, 1941 DOA: 11/05/2023  PCP: [facility] [address] [facility]  Admit date: 11/05/2023 Discharge date: 11/08/2023  Admitted from: Home Discharge disposition: SNF  Recommendations at discharge:  Complete the course of antibiotics with 5 more days of oral cefadroxil    Brief narrative: Gabrielle Gonzales is a 83 y.o. female with PMH significant for dementia, blindness 9/20, patient presented to the ED with complaint of altered mental status, multiple falls. At baseline, patient is verbal, incontinent, incontinent of urine and stools, dependent on activities of daily living.  Patient needs to be able to to ambulate around with a walker but has progressively declined in the last 3 to 4 weeks.  She has had multiple falls. she had 2 falls on 9/20, could not get up after the second call and hence brought to the ED by EMS.  In the ED, afebrile, hemodynamically stable, breathing on room air. WC count 8.3, hemoglobin 11.9, potassium low at 2.8 Urinalysis showed hazy amber-colored urine with moderate leukocytes, positive nitrate, many bacteria Urine culture sent. Started on IV Rocephin  Admitted to [facility]  Subjective: Patient was seen and examined this morning. Sitting up in bed.  Not in distress.  No new symptoms. Hemodynamically stable, afebrile, breathing room air Stable for discharge to SNF today.  Hospital course: E. coli UTI Presented with progressively declining status Urinalysis positive for infection Urine culture sent on admission grew E. coli, sensitive to cephalosporin Initially treated with IV Rocephin .  Clinically improved.  No fever.  WC count normal. Switch to oral cefadroxil  to continue for next 5 days postdischarge. Recent Labs  Lab 11/05/23 1924 11/06/23 0339  WBC 8.3 7.1   Acute metabolic encephalopathy  Dementia Secondary to UTI, physical decline.  Has underlying dementia CT head and MRI brain without acute  findings. At baseline, patient is verbal, incontinent, incontinent of urine and stools, dependent on activities of daily living.  Continue delirium precautions  Hypokalemia/hypomagnesemia Low potassium and magnesium  level as below. Improved with replacement Recent Labs  Lab 11/05/23 1924 11/06/23 0339 11/07/23 0547  K 2.8* 3.1* 4.4  MG  --  1.5* 1.8  PHOS  --  2.5  --     Elevated blood pressure Not on oral antihypertensives prior to admission Blood pressure was initially elevated.  Now normally 120s.     Impaired mobility Multiple falls At baseline she was ambulating with a walker PT OT evaluation obtained.  PT Follow up Rec: Skilled Nursing-Short Term Rehab (<3 Hours/Day) (Vs Home If Family Can Provide 24/7 Support And Physical Assist)11/06/2023 1554   Goals of care   Code Status: Limited: Do not attempt resuscitation (DNR) -DNR-LIMITED -Do Not Intubate/DNI     Diet:  Diet Order             Diet general           Diet regular Room service appropriate? Yes; Fluid consistency: Thin  Diet effective now                   Nutritional status:  There is no height or weight on file to calculate BMI.       Wounds:  - Wound 11/06/23 0130 Pressure Injury Sacrum Right;Left Stage 2 -  Partial thickness loss of dermis presenting as a shallow open injury with a red, pink wound bed without slough. (Active)  Date First Assessed/Time First Assessed: 11/06/23 0130   Present on Original Admission: Yes  Primary Wound Type: Pressure Injury  Location: Sacrum  Location Orientation: Right;Left  Staging: Stage 2 -  Partial thickness loss of dermis presenting as a ...    Assessments 11/06/2023  1:00 AM 11/08/2023  7:29 AM  Site / Wound Assessment Clean;Pink;Red Red;Purple  Peri-wound Assessment Intact Intact  Drainage Amount None --  Treatment Cleansed --  Dressing Type Foam - Lift dressing to assess site every shift Foam - Lift dressing to assess site every shift  Dressing Changed  New --     Active Orders  Date Order Priority Status Authorizing Provider  11/06/23 0650 Foam dressing Routine Active Arlice Reichert, MD    Discharge Medications:   Allergies as of 11/08/2023       Reactions   Atorvastatin Other (See Comments)   Headache   Niacin Other (See Comments)   Skin flushed and felt like it was crawling        Medication List     TAKE these medications    acetaminophen  325 MG tablet Commonly known as: TYLENOL  Take 2 tablets (650 mg total) by mouth every 6 (six) hours as needed for mild pain (pain score 1-3), fever or headache.   cefadroxil  500 MG capsule Commonly known as: DURICEF Take 2 capsules (1,000 mg total) by mouth 2 (two) times daily for 5 days.   melatonin 5 MG Tabs Take 1 tablet (5 mg total) by mouth at bedtime as needed.   polyethylene glycol 17 g packet Commonly known as: MIRALAX  / GLYCOLAX  Take 17 g by mouth daily as needed for mild constipation.               Discharge Care Instructions  (From admission, onward)           Start     Ordered   11/08/23 0000  Discharge wound care:        11/08/23 0847             Follow ups:    Contact information for follow-up providers     [facility] Follow up.   Contact information: [address] [place] [address] [phone]              Contact information for after-discharge care     Destination     [facility]   Service: Skilled Nursing Contact information: [address] [place]  [address] [phone]                     Discharge Instructions:   Discharge Instructions     Call MD for:  difficulty breathing, headache or visual disturbances   Complete by: As directed    Call MD for:  extreme fatigue   Complete by: As directed    Call MD for:  hives   Complete by: As directed    Call MD for:  persistant dizziness or light-headedness   Complete by: As directed    Call  MD for:  persistant nausea and vomiting   Complete by: As directed    Call MD for:  severe uncontrolled pain   Complete by: As directed    Call MD for:  temperature >100.4   Complete by: As directed    Diet general   Complete by: As directed    Discharge instructions   Complete by: As directed    Recommendations at discharge:   Complete the course of antibiotics with 5 more days of oral cefadroxil   General discharge instructions: Follow with [facility] [address]  in 7 days  Please request your PCP  to go over your hospital tests, procedures, radiology results at the follow up. Please get your medicines reviewed and adjusted.  Your PCP may decide to repeat certain labs or tests as needed. Do not drive, operate heavy machinery, perform activities at heights, swimming or participation in water activities or provide baby sitting services if your were admitted for syncope or siezures until you have seen by [facility] or a Neurologist and advised to do so again. [place]  Controlled Substance Reporting System database was reviewed. Do not drive, operate heavy machinery, perform activities at heights, swim, participate in water activities or provide baby-sitting services while on medications for pain, sleep and mood until your outpatient physician has reevaluated you and advised to do so again.  You are strongly recommended to comply with the dose, frequency and duration of prescribed medications. Activity: As tolerated with Full fall precautions use walker/cane & assistance as needed Avoid using any recreational substances like cigarette, tobacco, alcohol, or non-prescribed drug. If you experience worsening of your admission symptoms, develop shortness of breath, life threatening emergency, suicidal or homicidal thoughts you must seek medical attention immediately by calling 911 or calling your MD immediately  if symptoms less severe. You must read complete instructions/literature along  with all the possible adverse reactions/side effects for all the medicines you take and that have been prescribed to you. Take any new medicine only after you have completely understood and accepted all the possible adverse reactions/side effects.  Wear Seat belts while driving. You were cared for by a hospitalist during your hospital stay. If you have any questions about your discharge medications or the care you received while you were in the hospital after you are discharged, you can call the unit and ask to speak with the hospitalist or the covering physician. Once you are discharged, your primary care physician will handle any further medical issues. Please note that NO REFILLS for any discharge medications will be authorized once you are discharged, as it is imperative that you return to your primary care physician (or establish a relationship with a primary care physician if you do not have one).   Discharge wound care:   Complete by: As directed    Increase activity slowly   Complete by: As directed        Discharge Exam:   Vitals:   11/07/23 1549 11/07/23 2100 11/08/23 0300 11/08/23 0820  BP: 112/77 (!) 122/92 122/82 113/79  Pulse: 70 64 66 65  Resp: 16 14 16 15   Temp: 98.7 F (37.1 C) (!) 97.5 F (36.4 C) 97.9 F (36.6 C) 98.5 F (36.9 C)  TempSrc: Oral Oral Oral Oral  SpO2: 97% 99% 97% 95%    There is no height or weight on file to calculate BMI.   General exam: Pleasant, elderly Caucasian female.  Not in distress  Skin: No rashes, lesions or ulcers. HEENT: Atraumatic, normocephalic, no obvious bleeding Lungs: Clear to auscultation bilaterally,  CVS: S1, S2, no murmur,   GI/Abd: Soft, nontender, nondistended, bowel sound present,   CNS: Alert, awake, oriented to place only Psychiatry: Mood appropriate Extremities: No pedal edema, no calf tenderness,    The results of significant diagnostics from this hospitalization (including imaging, microbiology, ancillary and  laboratory) are listed below for reference.    Procedures and Diagnostic Studies:   MR BRAIN WO CONTRAST Result Date: 11/06/2023 CLINICAL DATA:  83 year old female with delirium.  Recent falls. EXAM: MRI HEAD WITHOUT  CONTRAST TECHNIQUE: Multiplanar, multiecho pulse sequences of the brain and surrounding structures were obtained without intravenous contrast. COMPARISON:  Head CT yesterday and earlier. FINDINGS: Brain: Near CSF intensity extra-axial collection along the right lateral convexity does appear to be in the subdural space, lobulated (series 11 images 27, 35, 39) and is in part chronic, but does appear more extensive since 07/12/2023 as noted on recent CT. Maximal thickness is 2 cm, the more anterior increased component of the collection since May is 6-7 mm thickness. There is mild mass effect on the underlying right frontal lobe (series 11, images 27 through 35), but no significant midline shift or ventricular asymmetry. No convincing layering hematocrit. Questionable superimposed left vertex arachnoid cyst (series 11, image 45) unchanged. Chronic hemosiderin left posterolateral temporal lobe on SWI series 12, image 31. Basilar cisterns remain normal. No superimposed restricted diffusion to suggest acute infarction. Cervicomedullary junction and pituitary are within normal limits. Periventricular and scattered cerebral white matter T2 and FLAIR hyperintensity is mild for age. 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 Mild to moderate T2 and FLAIR hyperintensity in the pons. Vascular: Major intracranial vascular flow voids are preserved. Skull and upper cervical spine: Negative for age visible cervical spine. Visualized bone marrow signal is within normal limits. Chronic bony remodeling of the right lateral skull in the area of maximal extra-axial fluid there demonstrated on previous CTs since May. Sinuses/Orbits: Leftward gaze.  Otherwise negative. Other: Mastoids are clear. There is a chronic left  posterior convexity dermal or subdermal complex cyst. IMPRESSION: 1. Chronic right frontal convexity lobulated and mildly loculated appearing low-density Subdural Hematoma (not quite isointense to CSF). This does appear mildly increased since 07/12/2023, although without associated midline shift, and there is associated chronic bony remodeling of a portion of the skull. 2. No other acute intracranial abnormality. 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 Electronically Signed   By: VEAR Hurst M.D.   On: 11/06/2023 05:18   DG Abd 1 View Result Date: 11/06/2023 EXAM: 1 VIEW XRAY OF THE ABDOMEN 11/06/2023 03:06:13 AM COMPARISON: None available. CLINICAL HISTORY: Encounter for imaging to screen for metal prior to MRI [id number]. MRI screening. FINDINGS: BOWEL: Nonobstructive bowel gas pattern. Moderate rectal stool burden. SOFT TISSUES: No radiopaque foreign body is seen. BONES: Old left pelvic ring fracture deformities. No acute osseous abnormality. IMPRESSION: 1. No radiopaque foreign body. Electronically signed by: Pinkie Pebbles MD 11/06/2023 03:22 AM EDT RP Workstation: [id number]   CT Head Wo Contrast Result Date: 11/05/2023 CLINICAL DATA:  Head and neck trauma EXAM: CT HEAD WITHOUT CONTRAST CT CERVICAL SPINE WITHOUT CONTRAST TECHNIQUE: Multidetector CT imaging of the head and cervical spine was performed following the standard protocol without intravenous contrast. Multiplanar CT image reconstructions of the cervical spine were also generated. RADIATION DOSE REDUCTION: This exam was performed according to the departmental dose-optimization program which includes automated exposure control, adjustment of the mA and/or kV according to patient size and/or use of iterative reconstruction technique. COMPARISON:  CT brain 07/12/2023, CT brain and cervical spine 07/11/2023 FINDINGS: CT HEAD FINDINGS Brain: No acute territorial infarction or  hemorrhage is visualized. Focal CSF density over the right temporoparietal region with mass effect, this measures 4.9 cm AP by 2 cm transverse and is grossly stable. 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 The ventricles are mildly prominent but grossly stable in size.  There is atrophy and chronic small vessel ischemic changes of the white matter. Suspect small chronic infarcts in the left thalamus. Vascular: No hyperdense vessels.  Carotid vascular calcification. Skull: Normal. Negative for fracture or focal lesion. Sinuses/Orbits: No acute finding. Other: Left posterior partially calcified scalp lesion measures slightly larger at 3.2 cm compared with 2.8 cm previously CT CERVICAL SPINE FINDINGS Alignment: Straightening of the cervical spine. No subluxation. Facet alignment is within normal limits. Skull base and vertebrae: No acute fracture. No primary bone lesion or focal pathologic process. Soft tissues and spinal canal: No prevertebral fluid or swelling. No visible canal hematoma. Disc levels: Moderate disc space narrowing C4-C5, C5-C6. Multilevel facet degenerative changes. Upper chest: Apical emphysema.  Carotid vascular calcification Other: None IMPRESSION: 1. No CT evidence for acute intracranial abnormality. Atrophy and chronic small vessel ischemic changes of the white matter. 2. 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 More focal CSF density over the right temporoparietal region is unchanged and may reflect arachnoid cyst or chronic focal hygroma 3. Straightening of the cervical spine with degenerative changes. No acute osseous abnormality. Electronically Signed   By: Luke Bun M.D.   On: 11/05/2023 20:35   CT Cervical Spine Wo Contrast Result Date: 11/05/2023 CLINICAL DATA:  Head and neck trauma EXAM: CT HEAD WITHOUT  CONTRAST CT CERVICAL SPINE WITHOUT CONTRAST TECHNIQUE: Multidetector CT imaging of the head and cervical spine was performed following the standard protocol without intravenous contrast. Multiplanar CT image reconstructions of the cervical spine were also generated. RADIATION DOSE REDUCTION: This exam was performed according to the departmental dose-optimization program which includes automated exposure control, adjustment of the mA and/or kV according to patient size and/or use of iterative reconstruction technique. COMPARISON:  CT brain 07/12/2023, CT brain and cervical spine 07/11/2023 FINDINGS: CT HEAD FINDINGS Brain: No acute territorial infarction or hemorrhage is visualized. Focal CSF density over the right temporoparietal region with mass effect, this measures 4.9 cm AP by 2 cm transverse and is grossly stable. 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 The ventricles are mildly prominent but grossly stable in size. There is atrophy and chronic small vessel ischemic changes of the white matter. Suspect small chronic infarcts in the left thalamus. Vascular: No hyperdense vessels.  Carotid vascular calcification. Skull: Normal. Negative for fracture or focal lesion. Sinuses/Orbits: No acute finding. Other: Left posterior partially calcified scalp lesion measures slightly larger at 3.2 cm compared with 2.8 cm previously CT CERVICAL SPINE FINDINGS Alignment: Straightening of the cervical spine. No subluxation. Facet alignment is within normal limits. Skull base and vertebrae: No acute fracture. No primary bone lesion or focal pathologic process. Soft tissues and spinal canal: No prevertebral fluid or swelling. No visible canal hematoma. Disc levels: Moderate disc space narrowing C4-C5, C5-C6. Multilevel facet degenerative changes. Upper chest: Apical emphysema.  Carotid vascular calcification Other: None IMPRESSION: 1. No  CT evidence for acute intracranial abnormality. Atrophy and chronic small vessel ischemic changes of the white matter. 2. 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 More focal CSF density over the right temporoparietal region is unchanged and may reflect arachnoid cyst or chronic focal hygroma 3. Straightening of the cervical spine with degenerative changes. No acute osseous abnormality. Electronically Signed   By: Luke Bun M.D.   On: 11/05/2023  20:35   DG Pelvis Portable Result Date: 11/05/2023 CLINICAL DATA:  Fall EXAM: PORTABLE PELVIS 1-2 VIEWS COMPARISON:  None Available. FINDINGS: No acute bony abnormality. Specifically, no fracture, subluxation, or dislocation. Hip joints and SI joints symmetric. Diffuse osteopenia. IMPRESSION: No acute bony abnormality. Electronically Signed   By: Franky Crease M.D.   On: 11/05/2023 19:29   DG Chest Portable 1 View Result Date: 11/05/2023 CLINICAL DATA:  Fall EXAM: PORTABLE CHEST 1 VIEW COMPARISON:  07/11/2023 FINDINGS: Heart and mediastinal contours within normal limits. Aortic atherosclerosis. Bibasilar atelectasis. No effusions. No acute bony abnormality. IMPRESSION: Bibasilar atelectasis. Electronically Signed   By: Franky Crease M.D.   On: 11/05/2023 19:28     Labs:   Basic Metabolic Panel: Recent Labs  Lab 11/05/23 1924 11/06/23 0339 11/07/23 0547  NA 140 139 135  K 2.8* 3.1* 4.4  CL 103 104 105  CO2 24 23 20*  GLUCOSE 79 74 83  BUN 11 9 9   CREATININE 0.80 0.78 0.78  CALCIUM 8.3* 7.8* 7.6*  MG  --  1.5* 1.8  PHOS  --  2.5  --    GFR CrCl cannot be calculated (Unknown ideal weight.). Liver Function Tests: No results for input(s): AST, ALT, ALKPHOS, BILITOT, PROT, ALBUMIN in the last 168 hours. No results for input(s): LIPASE, AMYLASE in the last 168 hours. No results for input(s): AMMONIA in the last 168 hours. Coagulation  profile No results for input(s): INR, PROTIME in the last 168 hours.  CBC: Recent Labs  Lab 11/05/23 1924 11/06/23 0339  WBC 8.3 7.1  NEUTROABS 5.4  --   HGB 11.9* 11.5*  HCT 36.4 34.3*  MCV 89.0 87.9  PLT 239 248   Cardiac Enzymes: No results for input(s): CKTOTAL, CKMB, CKMBINDEX, TROPONINI in the last 168 hours. BNP: Invalid input(s): POCBNP CBG: No results for input(s): GLUCAP in the last 168 hours. D-Dimer No results for input(s): DDIMER in the last 72 hours. Hgb A1c No results for input(s): HGBA1C in the last 72 hours. Lipid Profile No results for input(s): CHOL, HDL, LDLCALC, TRIG, CHOLHDL, LDLDIRECT in the last 72 hours. Thyroid function studies No results for input(s): TSH, T4TOTAL, T3FREE, THYROIDAB in the last 72 hours.  Invalid input(s): FREET3 Anemia work up No results for input(s): VITAMINB12, FOLATE, FERRITIN, TIBC, IRON, RETICCTPCT in the last 72 hours. Microbiology Recent Results (from the past 240 hours)  Urine Culture     Status: Abnormal   Collection Time: 11/05/23  7:47 PM   Specimen: Urine, Random  Result Value Ref Range Status   Specimen Description URINE, RANDOM  Final   Special Requests   Final    NONE Reflexed from [phone] Performed at [facility], [address] [place], [place] [address]    Culture >=100,000 COLONIES/mL ESCHERICHIA COLI (A)  Final   Report Status 11/07/2023 FINAL  Final   Organism ID, Bacteria ESCHERICHIA COLI (A)  Final      Susceptibility   Escherichia coli - MIC*    AMPICILLIN <=2 SENSITIVE Sensitive     CEFAZOLIN (URINE) Value in next row Sensitive      <=1 SENSITIVEThis is a modified FDA-approved test that has been validated and its performance characteristics determined by the reporting laboratory.  This laboratory is certified under the Clinical Laboratory Improvement Amendments CLIA as qualified to perform high complexity clinical laboratory testing.     CEFEPIME Value in next row Sensitive      <=1 SENSITIVEThis is a modified FDA-approved test  that has been validated and its performance characteristics determined by the reporting laboratory.  This laboratory is certified under the Clinical Laboratory Improvement Amendments CLIA as qualified to perform high complexity clinical laboratory testing.    ERTAPENEM Value in next row Sensitive      <=1 SENSITIVEThis is a modified FDA-approved test that has been validated and its performance characteristics determined by the reporting laboratory.  This laboratory is certified under the Clinical Laboratory Improvement Amendments CLIA as qualified to perform high complexity clinical laboratory testing.    CEFTRIAXONE  Value in next row Sensitive      <=1 SENSITIVEThis is a modified FDA-approved test that has been validated and its performance characteristics determined by the reporting laboratory.  This laboratory is certified under the Clinical Laboratory Improvement Amendments CLIA as qualified to perform high complexity clinical laboratory testing.    CIPROFLOXACIN Value in next row Sensitive      <=1 SENSITIVEThis is a modified FDA-approved test that has been validated and its performance characteristics determined by the reporting laboratory.  This laboratory is certified under the Clinical Laboratory Improvement Amendments CLIA as qualified to perform high complexity clinical laboratory testing.    GENTAMICIN Value in next row Sensitive      <=1 SENSITIVEThis is a modified FDA-approved test that has been validated and its performance characteristics determined by the reporting laboratory.  This laboratory is certified under the Clinical Laboratory Improvement Amendments CLIA as qualified to perform high complexity clinical laboratory testing.    NITROFURANTOIN Value in next row Sensitive      <=1 SENSITIVEThis is a modified FDA-approved test that has been validated and its performance characteristics  determined by the reporting laboratory.  This laboratory is certified under the Clinical Laboratory Improvement Amendments CLIA as qualified to perform high complexity clinical laboratory testing.    TRIMETH/SULFA Value in next row Resistant      <=1 SENSITIVEThis is a modified FDA-approved test that has been validated and its performance characteristics determined by the reporting laboratory.  This laboratory is certified under the Clinical Laboratory Improvement Amendments CLIA as qualified to perform high complexity clinical laboratory testing.    AMPICILLIN/SULBACTAM Value in next row Sensitive      <=1 SENSITIVEThis is a modified FDA-approved test that has been validated and its performance characteristics determined by the reporting laboratory.  This laboratory is certified under the Clinical Laboratory Improvement Amendments CLIA as qualified to perform high complexity clinical laboratory testing.    PIP/TAZO Value in next row Sensitive      <=4 SENSITIVEThis is a modified FDA-approved test that has been validated and its performance characteristics determined by the reporting laboratory.  This laboratory is certified under the Clinical Laboratory Improvement Amendments CLIA as qualified to perform high complexity clinical laboratory testing.    MEROPENEM Value in next row Sensitive      <=4 SENSITIVEThis is a modified FDA-approved test that has been validated and its performance characteristics determined by the reporting laboratory.  This laboratory is certified under the Clinical Laboratory Improvement Amendments CLIA as qualified to perform high complexity clinical laboratory testing.    * >=100,000 COLONIES/mL ESCHERICHIA COLI    Time coordinating discharge: 45 minutes  Signed: Myria Steenbergen  [facility] 11/08/2023, 11:24 AM

## 2023-11-08 NOTE — Progress Notes (Signed)
 Gabrielle Gonzales to be D/C'd  per MD order.  Discussed with the nurse- Tillman, RN and all questions fully answered. Attempted to call [facility] twice and been on hold for the second time. [facility] is here.  VSS, Skin clean, dry and intact without evidence of skin break down, skin tear reported as seen in EPIC. Foam dressing in place.  IV catheter discontinued intact. Site without signs and symptoms of complications. Dressing and pressure applied.  An After Visit Summary was printed and given to the [facility].

## 2023-11-08 NOTE — TOC Progression Note (Signed)
 Transition of Care [facility]) - Progression Note    Patient Details  Name: Gabrielle Gonzales MRN: [id number] Date of Birth: 1940/10/30  [facility] CM/SW Contact  Inocente GORMAN Kindle, LCSW Phone Number: 11/08/2023, 8:51 AM  Clinical Narrative:    [facility] able to accept patient today. CSW updated patient's son who requested [facility] for transport as he and his brother are not comfortable taking patient via car.      Barriers to Discharge: Barriers Resolved               Expected Discharge Plan and Services In-house Referral: Clinical Social Work     Living arrangements for the past 2 months: Single Family Home Expected Discharge Date: 11/08/23                                     Social Drivers of Health (SDOH) Interventions SDOH Screenings   Food Insecurity: Patient Unable To Answer (11/06/2023)  Housing: Patient Unable To Answer (11/06/2023)  Transportation Needs: Patient Unable To Answer (11/06/2023)  Utilities: Patient Unable To Answer (11/06/2023)  Depression (PHQ2-9): Low Risk  (12/12/2018)  Social Connections: Patient Unable To Answer (11/06/2023)  Tobacco Use: High Risk (11/05/2023)    Readmission Risk Interventions     No data to display

## 2024-03-21 ENCOUNTER — Ambulatory Visit: Admitting: Physician Assistant

## 2024-03-21 ENCOUNTER — Encounter: Payer: Self-pay | Admitting: Physician Assistant

## 2024-03-21 VITALS — BP 117/75 | HR 83

## 2024-03-21 DIAGNOSIS — D485 Neoplasm of uncertain behavior of skin: Secondary | ICD-10-CM

## 2024-03-21 NOTE — Patient Instructions (Signed)

## 2024-03-21 NOTE — Progress Notes (Signed)
" ° °  New Patient Visit   Subjective  Gabrielle Gonzales is a 84 y.o. female NEW PATIENT who presents for the following: nodular lesion with prerectal wound located on her bottom.   Pt reports areas are bothersome and endorses itchiness. Pt's son is present for visit today and explained pt has a rash on her bottom 6 months ago and RA's at her facility are currently using a cream.    The following portions of the chart were reviewed this encounter and updated as appropriate: medications, allergies, medical history  Review of Systems:  No other skin or systemic complaints except as noted in HPI or Assessment and Plan.  Objective  Well appearing patient in no apparent distress; mood and affect are within normal limits.  A focused examination was performed of the following areas: Genital and peri-rectal area.   Relevant exam findings are noted in the Assessment and Plan.  Left perinium anterior Ulcerated pink and violaceous plaque 4.9 cm  Left perinium posterior Ulcerated pink and violaceous plaque 4.9 cm  Assessment & Plan     NEOPLASM OF UNCERTAIN BEHAVIOR OF SKIN (2) Left perinium anterior - Skin / nail biopsy Type of biopsy: punch   Informed consent: discussed and consent obtained   Timeout: patient name, date of birth, surgical site, and procedure verified   Procedure prep:  Patient was prepped and draped in usual sterile fashion (the patient was cleaned and prepped) Prep type:  Isopropyl alcohol Anesthesia: the lesion was anesthetized in a standard fashion   Anesthetic:  1% lidocaine  w/ epinephrine 1-100,000 buffered w/ 8.4% NaHCO3 Punch size:  4 mm Hemostasis achieved with: pressure and Gelfoam   Outcome: patient tolerated procedure well   Post-procedure details: sterile dressing applied and wound care instructions given   Dressing type: bandage, petrolatum and pressure dressing    Specimen 1 - Surgical pathology Differential Diagnosis: BCC vs SCC vs MM  Check Margins:  No Left perinium posterior - Skin / nail biopsy Type of biopsy: punch   Informed consent: discussed and consent obtained   Timeout: patient name, date of birth, surgical site, and procedure verified   Procedure prep:  Patient was prepped and draped in usual sterile fashion (the patient was cleaned and prepped) Prep type:  Isopropyl alcohol Anesthesia: the lesion was anesthetized in a standard fashion   Anesthetic:  1% lidocaine  w/ epinephrine 1-100,000 buffered w/ 8.4% NaHCO3 Punch size:  4 mm Hemostasis achieved with: pressure and Gelfoam   Outcome: patient tolerated procedure well   Post-procedure details: sterile dressing applied and wound care instructions given   Dressing type: bandage, petrolatum and pressure dressing    Specimen 2 - Surgical pathology Differential Diagnosis: BCC Vs SCC vs MM  Check Margins: No  No follow-ups on file.  LILLETTE Virgle Boards, Dermatology [employer] am acting as a neurosurgeon for Jariah Jarmon K, PA-C.  Documentation: I have reviewed the above documentation for accuracy and completeness, and I agree with the above.  Pietro Bonura K, PA-C    "

## 2024-03-22 LAB — SURGICAL PATHOLOGY
